# Patient Record
Sex: Female | Born: 1935 | Race: White | Hispanic: No | Smoking: Former smoker
Health system: Southern US, Community
[De-identification: ages and names within clinical notes are randomized; demographics above are authoritative.]

## PROBLEM LIST (undated history)

## (undated) DIAGNOSIS — G4700 Insomnia, unspecified: Secondary | ICD-10-CM

## (undated) DIAGNOSIS — Z86010 Personal history of colonic polyps: Secondary | ICD-10-CM

## (undated) DIAGNOSIS — R5381 Other malaise: Secondary | ICD-10-CM

## (undated) DIAGNOSIS — I10 Essential (primary) hypertension: Secondary | ICD-10-CM

## (undated) DIAGNOSIS — F068 Other specified mental disorders due to known physiological condition: Secondary | ICD-10-CM

## (undated) DIAGNOSIS — R413 Other amnesia: Secondary | ICD-10-CM

## (undated) DIAGNOSIS — N390 Urinary tract infection, site not specified: Secondary | ICD-10-CM

## (undated) DIAGNOSIS — N959 Unspecified menopausal and perimenopausal disorder: Secondary | ICD-10-CM

## (undated) DIAGNOSIS — M949 Disorder of cartilage, unspecified: Secondary | ICD-10-CM

## (undated) DIAGNOSIS — R300 Dysuria: Secondary | ICD-10-CM

## (undated) DIAGNOSIS — R232 Flushing: Secondary | ICD-10-CM

## (undated) DIAGNOSIS — K5730 Diverticulosis of large intestine without perforation or abscess without bleeding: Secondary | ICD-10-CM

## (undated) DIAGNOSIS — R5383 Other fatigue: Secondary | ICD-10-CM

## (undated) DIAGNOSIS — E785 Hyperlipidemia, unspecified: Secondary | ICD-10-CM

## (undated) DIAGNOSIS — M899 Disorder of bone, unspecified: Secondary | ICD-10-CM

## (undated) HISTORY — DX: Other fatigue: R53.83

## (undated) HISTORY — DX: Other specified mental disorders due to known physiological condition: F06.8

## (undated) HISTORY — DX: Disorder of cartilage, unspecified: M94.9

## (undated) HISTORY — DX: Other malaise: R53.81

## (undated) HISTORY — DX: Personal history of colonic polyps: Z86.010

## (undated) HISTORY — DX: Insomnia, unspecified: G47.00

## (undated) HISTORY — DX: Other amnesia: R41.3

## (undated) HISTORY — DX: Diverticulosis of large intestine without perforation or abscess without bleeding: K57.30

## (undated) HISTORY — PX: OTHER SURGICAL HISTORY: SHX169

## (undated) HISTORY — DX: Flushing: R23.2

## (undated) HISTORY — DX: Essential (primary) hypertension: I10

## (undated) HISTORY — DX: Disorder of bone, unspecified: M89.9

## (undated) HISTORY — DX: Urinary tract infection, site not specified: N39.0

## (undated) HISTORY — DX: Unspecified menopausal and perimenopausal disorder: N95.9

## (undated) HISTORY — DX: Dysuria: R30.0

## (undated) HISTORY — DX: Hyperlipidemia, unspecified: E78.5

---

## 1968-07-25 HISTORY — PX: CHOLECYSTECTOMY: SHX55

## 1968-07-25 HISTORY — PX: APPENDECTOMY: SHX54

## 1983-07-26 HISTORY — PX: ABDOMINAL HYSTERECTOMY: SHX81

## 1998-10-26 ENCOUNTER — Other Ambulatory Visit: Admission: RE | Admit: 1998-10-26 | Discharge: 1998-10-26 | Payer: Self-pay | Admitting: *Deleted

## 2000-03-26 ENCOUNTER — Encounter: Payer: Self-pay | Admitting: Emergency Medicine

## 2000-03-26 ENCOUNTER — Emergency Department (HOSPITAL_COMMUNITY): Admission: EM | Admit: 2000-03-26 | Discharge: 2000-03-26 | Payer: Self-pay | Admitting: Emergency Medicine

## 2001-03-05 ENCOUNTER — Encounter: Payer: Self-pay | Admitting: Cardiology

## 2001-03-05 ENCOUNTER — Ambulatory Visit (HOSPITAL_COMMUNITY): Admission: RE | Admit: 2001-03-05 | Discharge: 2001-03-05 | Payer: Self-pay | Admitting: Cardiology

## 2003-01-13 ENCOUNTER — Ambulatory Visit (HOSPITAL_COMMUNITY): Admission: RE | Admit: 2003-01-13 | Discharge: 2003-01-13 | Payer: Self-pay | Admitting: *Deleted

## 2003-07-26 HISTORY — PX: OTHER SURGICAL HISTORY: SHX169

## 2005-12-08 ENCOUNTER — Encounter: Admission: RE | Admit: 2005-12-08 | Discharge: 2005-12-08 | Payer: Self-pay | Admitting: Family Medicine

## 2006-08-02 ENCOUNTER — Ambulatory Visit: Payer: Self-pay | Admitting: Internal Medicine

## 2006-12-29 ENCOUNTER — Ambulatory Visit: Payer: Self-pay | Admitting: Internal Medicine

## 2007-01-25 ENCOUNTER — Ambulatory Visit: Payer: Self-pay | Admitting: Internal Medicine

## 2007-01-25 LAB — CONVERTED CEMR LAB
ALT: 21 units/L (ref 0–35)
Bilirubin, Direct: 0.1 mg/dL (ref 0.0–0.3)
CO2: 30 meq/L (ref 19–32)
Calcium: 9.6 mg/dL (ref 8.4–10.5)
Cholesterol: 225 mg/dL (ref 0–200)
Direct LDL: 136.1 mg/dL
GFR calc Af Amer: 90 mL/min
GFR calc non Af Amer: 75 mL/min
Glucose, Bld: 95 mg/dL (ref 70–99)
HDL: 67.6 mg/dL (ref >39.0)
Sodium: 142 meq/L (ref 135–145)
Total CHOL/HDL Ratio: 3.3
Total Protein: 6.9 g/dL (ref 6.0–8.3)
Triglycerides: 137 mg/dL (ref 0–149)
Vit D, 1,25-Dihydroxy: 47 (ref 20–57)

## 2007-02-08 ENCOUNTER — Ambulatory Visit: Payer: Self-pay | Admitting: Internal Medicine

## 2007-10-09 ENCOUNTER — Encounter: Payer: Self-pay | Admitting: Internal Medicine

## 2007-10-19 ENCOUNTER — Ambulatory Visit: Payer: Self-pay | Admitting: Internal Medicine

## 2007-10-19 LAB — CONVERTED CEMR LAB
ALT: 21 units/L (ref 0–35)
Cholesterol: 204 mg/dL (ref 0–200)
Direct LDL: 124.9 mg/dL
Total CHOL/HDL Ratio: 3
VLDL: 14 mg/dL (ref 0–40)

## 2007-10-26 ENCOUNTER — Ambulatory Visit: Payer: Self-pay | Admitting: Internal Medicine

## 2007-10-26 DIAGNOSIS — E785 Hyperlipidemia, unspecified: Secondary | ICD-10-CM

## 2007-10-26 HISTORY — DX: Hyperlipidemia, unspecified: E78.5

## 2007-10-26 LAB — CONVERTED CEMR LAB

## 2008-01-02 ENCOUNTER — Telehealth: Payer: Self-pay | Admitting: Internal Medicine

## 2008-01-23 ENCOUNTER — Encounter: Payer: Self-pay | Admitting: Internal Medicine

## 2008-01-24 ENCOUNTER — Encounter: Payer: Self-pay | Admitting: Internal Medicine

## 2008-02-20 ENCOUNTER — Encounter: Payer: Self-pay | Admitting: Internal Medicine

## 2008-02-25 ENCOUNTER — Ambulatory Visit: Payer: Self-pay | Admitting: Internal Medicine

## 2008-02-25 DIAGNOSIS — M949 Disorder of cartilage, unspecified: Secondary | ICD-10-CM

## 2008-02-25 DIAGNOSIS — M899 Disorder of bone, unspecified: Secondary | ICD-10-CM | POA: Insufficient documentation

## 2008-02-25 DIAGNOSIS — Z860100 Personal history of colon polyps, unspecified: Secondary | ICD-10-CM

## 2008-02-25 DIAGNOSIS — R413 Other amnesia: Secondary | ICD-10-CM

## 2008-02-25 HISTORY — DX: Other amnesia: R41.3

## 2008-02-25 HISTORY — DX: Personal history of colonic polyps: Z86.010

## 2008-02-25 HISTORY — DX: Disorder of bone, unspecified: M89.9

## 2008-02-25 HISTORY — DX: Personal history of colon polyps, unspecified: Z86.0100

## 2008-02-26 LAB — CONVERTED CEMR LAB
Alkaline Phosphatase: 81 units/L (ref 39–117)
Basophils Relative: 0.5 % (ref 0.0–3.0)
Bilirubin, Direct: 0.2 mg/dL (ref 0.0–0.3)
CO2: 32 meq/L (ref 19–32)
Chloride: 106 meq/L (ref 96–112)
Cholesterol: 158 mg/dL (ref 0–200)
Eosinophils Relative: 1.7 % (ref 0.0–5.0)
GFR calc Af Amer: 79 mL/min
Glucose, Bld: 88 mg/dL (ref 70–99)
Hemoglobin: 14.6 g/dL (ref 12.0–15.0)
LDL Cholesterol: 82 mg/dL (ref 0–99)
Lymphocytes Relative: 29.9 % (ref 12.0–46.0)
Monocytes Relative: 9.7 % (ref 3.0–12.0)
Mucus, UA: NEGATIVE
Neutrophils Relative %: 58.2 % (ref 43.0–77.0)
Potassium: 4.5 meq/L (ref 3.5–5.1)
RBC: 4.55 M/uL (ref 3.87–5.11)
Sodium: 142 meq/L (ref 135–145)
Specific Gravity, Urine: <=1.005 (ref 1.000–1.03)
Total CHOL/HDL Ratio: 2.6
Total Protein: 6.9 g/dL (ref 6.0–8.3)
Urobilinogen, UA: 0.2 (ref 0.0–1.0)
Vit D, 1,25-Dihydroxy: 41 (ref 30–89)
WBC: 7.4 10*3/uL (ref 4.5–10.5)
pH: 7 (ref 5.0–8.0)

## 2008-02-27 ENCOUNTER — Telehealth (INDEPENDENT_AMBULATORY_CARE_PROVIDER_SITE_OTHER): Payer: Self-pay | Admitting: *Deleted

## 2008-03-14 ENCOUNTER — Telehealth (INDEPENDENT_AMBULATORY_CARE_PROVIDER_SITE_OTHER): Payer: Self-pay | Admitting: *Deleted

## 2008-05-16 ENCOUNTER — Encounter: Payer: Self-pay | Admitting: Internal Medicine

## 2008-05-22 ENCOUNTER — Encounter: Payer: Self-pay | Admitting: Internal Medicine

## 2008-05-29 ENCOUNTER — Telehealth: Payer: Self-pay | Admitting: Internal Medicine

## 2008-08-19 ENCOUNTER — Encounter: Payer: Self-pay | Admitting: Internal Medicine

## 2008-09-01 ENCOUNTER — Ambulatory Visit: Payer: Self-pay | Admitting: Internal Medicine

## 2008-09-01 DIAGNOSIS — I10 Essential (primary) hypertension: Secondary | ICD-10-CM | POA: Insufficient documentation

## 2008-09-01 HISTORY — DX: Essential (primary) hypertension: I10

## 2009-02-18 ENCOUNTER — Ambulatory Visit: Payer: Self-pay | Admitting: Internal Medicine

## 2009-02-18 LAB — CONVERTED CEMR LAB
AST: 35 units/L (ref 0–37)
Alkaline Phosphatase: 80 units/L (ref 39–117)
Basophils Absolute: 0 10*3/uL (ref 0.0–0.1)
CO2: 32 meq/L (ref 19–32)
Chloride: 109 meq/L (ref 96–112)
Creatinine, Ser: 0.9 mg/dL (ref 0.4–1.2)
HDL: 76.2 mg/dL (ref >39.00)
Ketones, ur: NEGATIVE mg/dL
LDL Cholesterol: 101 mg/dL — ABNORMAL HIGH (ref 0–99)
Lymphocytes Relative: 36.1 % (ref 12.0–46.0)
Monocytes Relative: 9.6 % (ref 3.0–12.0)
Neutrophils Relative %: 49 % (ref 43.0–77.0)
Platelets: 203 10*3/uL (ref 150.0–400.0)
RDW: 12.7 % (ref 11.5–14.6)
Sodium: 145 meq/L (ref 135–145)
Specific Gravity, Urine: 1.01 (ref 1.000–1.030)
Total Bilirubin: 0.9 mg/dL (ref 0.3–1.2)
Total CHOL/HDL Ratio: 2
Triglycerides: 50 mg/dL (ref 0.0–149.0)
Urine Glucose: NEGATIVE mg/dL
pH: 7 (ref 5.0–8.0)

## 2009-02-25 ENCOUNTER — Ambulatory Visit: Payer: Self-pay | Admitting: Internal Medicine

## 2009-02-26 ENCOUNTER — Ambulatory Visit (HOSPITAL_COMMUNITY): Admission: RE | Admit: 2009-02-26 | Discharge: 2009-02-26 | Payer: Self-pay | Admitting: Internal Medicine

## 2009-04-07 ENCOUNTER — Ambulatory Visit: Payer: Self-pay | Admitting: Gastroenterology

## 2009-04-21 ENCOUNTER — Ambulatory Visit: Payer: Self-pay | Admitting: Gastroenterology

## 2009-05-18 ENCOUNTER — Ambulatory Visit: Payer: Self-pay | Admitting: Internal Medicine

## 2009-05-18 DIAGNOSIS — N390 Urinary tract infection, site not specified: Secondary | ICD-10-CM

## 2009-05-18 HISTORY — DX: Urinary tract infection, site not specified: N39.0

## 2009-05-18 LAB — CONVERTED CEMR LAB
Bilirubin Urine: NEGATIVE
Ketones, urine, test strip: NEGATIVE
Specific Gravity, Urine: 1.01
Urobilinogen, UA: 0.2

## 2009-05-19 ENCOUNTER — Encounter: Payer: Self-pay | Admitting: Internal Medicine

## 2009-08-27 ENCOUNTER — Ambulatory Visit: Payer: Self-pay | Admitting: Internal Medicine

## 2009-08-27 DIAGNOSIS — F068 Other specified mental disorders due to known physiological condition: Secondary | ICD-10-CM

## 2009-08-27 HISTORY — DX: Other specified mental disorders due to known physiological condition: F06.8

## 2009-09-02 ENCOUNTER — Telehealth (INDEPENDENT_AMBULATORY_CARE_PROVIDER_SITE_OTHER): Payer: Self-pay | Admitting: *Deleted

## 2009-10-21 ENCOUNTER — Ambulatory Visit: Payer: Self-pay | Admitting: Internal Medicine

## 2009-10-21 DIAGNOSIS — R5383 Other fatigue: Secondary | ICD-10-CM

## 2009-10-21 DIAGNOSIS — R5381 Other malaise: Secondary | ICD-10-CM

## 2009-10-21 DIAGNOSIS — R232 Flushing: Secondary | ICD-10-CM

## 2009-10-21 DIAGNOSIS — G4700 Insomnia, unspecified: Secondary | ICD-10-CM

## 2009-10-21 HISTORY — DX: Other malaise: R53.81

## 2009-10-21 HISTORY — DX: Flushing: R23.2

## 2009-10-21 HISTORY — DX: Insomnia, unspecified: G47.00

## 2009-10-23 ENCOUNTER — Encounter: Payer: Self-pay | Admitting: Internal Medicine

## 2009-10-27 LAB — CONVERTED CEMR LAB: Metaneph Total, Ur: 225 ug/24hr (ref 224–832)

## 2010-02-18 ENCOUNTER — Ambulatory Visit: Payer: Self-pay | Admitting: Internal Medicine

## 2010-02-19 LAB — CONVERTED CEMR LAB
ALT: 25 units/L (ref 0–35)
AST: 42 units/L — ABNORMAL HIGH (ref 0–37)
Albumin: 3.9 g/dL (ref 3.5–5.2)
Alkaline Phosphatase: 77 units/L (ref 39–117)
BUN: 17 mg/dL (ref 6–23)
Basophils Relative: 0.7 % (ref 0.0–3.0)
Bilirubin Urine: NEGATIVE
CO2: 29 meq/L (ref 19–32)
Calcium: 8.7 mg/dL (ref 8.4–10.5)
Creatinine, Ser: 0.9 mg/dL (ref 0.4–1.2)
Eosinophils Relative: 7 % — ABNORMAL HIGH (ref 0.0–5.0)
HCT: 42.9 % (ref 36.0–46.0)
Hemoglobin, Urine: NEGATIVE
Hemoglobin: 14.6 g/dL (ref 12.0–15.0)
Lymphocytes Relative: 33.8 % (ref 12.0–46.0)
Lymphs Abs: 2 10*3/uL (ref 0.7–4.0)
Monocytes Relative: 12.8 % — ABNORMAL HIGH (ref 3.0–12.0)
Neutro Abs: 2.7 10*3/uL (ref 1.4–7.7)
Nitrite: NEGATIVE
RBC: 4.61 M/uL (ref 3.87–5.11)
TSH: 2.23 microintl units/mL (ref 0.35–5.50)
Total CHOL/HDL Ratio: 2
Total Protein, Urine: NEGATIVE mg/dL
Triglycerides: 41 mg/dL (ref 0.0–149.0)
Urine Glucose: NEGATIVE mg/dL
WBC: 5.9 10*3/uL (ref 4.5–10.5)
pH: 7.5 (ref 5.0–8.0)

## 2010-02-25 ENCOUNTER — Ambulatory Visit: Payer: Self-pay | Admitting: Family Medicine

## 2010-02-25 ENCOUNTER — Ambulatory Visit: Payer: Self-pay | Admitting: Internal Medicine

## 2010-02-25 DIAGNOSIS — N959 Unspecified menopausal and perimenopausal disorder: Secondary | ICD-10-CM

## 2010-02-25 HISTORY — DX: Unspecified menopausal and perimenopausal disorder: N95.9

## 2010-03-02 ENCOUNTER — Ambulatory Visit (HOSPITAL_COMMUNITY): Admission: RE | Admit: 2010-03-02 | Discharge: 2010-03-02 | Payer: Self-pay | Admitting: Internal Medicine

## 2010-04-12 ENCOUNTER — Ambulatory Visit: Payer: Self-pay | Admitting: Internal Medicine

## 2010-04-12 DIAGNOSIS — R300 Dysuria: Secondary | ICD-10-CM

## 2010-04-12 HISTORY — DX: Dysuria: R30.0

## 2010-04-12 LAB — CONVERTED CEMR LAB
Bilirubin Urine: NEGATIVE
Blood in Urine, dipstick: NEGATIVE
Ketones, urine, test strip: NEGATIVE
Urobilinogen, UA: 0.2
pH: 5

## 2010-04-13 ENCOUNTER — Encounter: Payer: Self-pay | Admitting: Internal Medicine

## 2010-04-16 ENCOUNTER — Encounter: Payer: Self-pay | Admitting: Internal Medicine

## 2010-04-16 ENCOUNTER — Telehealth: Payer: Self-pay | Admitting: Internal Medicine

## 2010-08-15 ENCOUNTER — Encounter: Payer: Self-pay | Admitting: Internal Medicine

## 2010-08-24 NOTE — Assessment & Plan Note (Signed)
Summary: 6 MO ROV/ NWS   Vital Signs:  Patient profile:   77 year old female Height:      64 inches Weight:      149 pounds BMI:     25.67 O2 Sat:      99 % on Room air Temp:     97 degrees F oral Pulse rate:   72 / minute BP sitting:   112 / 68  (left arm) Cuff size:   regular  Vitals Entered ByZella Ball Ewing (August 27, 2009 8:10 AM)  O2 Flow:  Room air  CC: 6 Mo ROV/RE   CC:  6 Mo ROV/RE.  History of Present Illness: overall doing well, no complaints, Pt denies CP, sob, doe, wheezing, orthopnea, pnd, worsening LE edema, palps, dizziness or syncope  Pt denies new neuro symptoms such as headache, facial or extremity weakness   Wt overall stable from last visit.  Memory short term mildly worse, cant remember her last colon and mammogram date though it was just in the fall, and forgot to f/u with dr borden/urology.    Problems Prior to Update: 1)  Dementia  (ICD-294.8) 2)  Uti  (ICD-599.0) 3)  Uti  (ICD-599.0) 4)  Neoplasm, Malignant, Colon, Family Hx, Sibling  (ICD-V16.0) 5)  Preventive Health Care  (ICD-V70.0) 6)  Hypertension  (ICD-401.9) 7)  Memory Loss  (ICD-780.93) 8)  Colonic Polyps, Hx of  (ICD-V12.72) 9)  Preventive Health Care  (ICD-V70.0) 10)  Osteopenia  (ICD-733.90) 11)  Health Maintenance Exam  (ICD-V70.0) 12)  Family History of Cad Female 1st Degree Relative <50  (ICD-V17.3) 13)  Hyperlipidemia  (ICD-272.4)  Medications Prior to Update: 1)  Avapro 150 Mg  Tabs (Irbesartan) .... Take 1/2 Tablet By Mouth Once A Day 2)  Crestor 40 Mg Tabs (Rosuvastatin Calcium) .... 1/2 By Mouth Once Daily 3)  Estroven   Tabs (Nutritional Supplements) .... Take 1 Tablet By Mouth Once A Day 4)  Garlique 400 Mg  Tbec (Garlic) .... Take 1 Tablet By Mouth Once A Day 5)  Centrum Silver   Tabs (Multiple Vitamins-Minerals) .... Take 1 Tablet By Mouth Once A Day 6)  Cranberry Plus Vitamin C 140-100-3 Mg-Mg-Unit  Caps (Cranberry-Vitamin C-Vitamin E) .... Take 1 Tablet By Mouth Once A  Day 7)  Low-Dose Aspirin 81 Mg  Tabs (Aspirin) .... Take 1 Tablet By Mouth Once A Day 8)  Metamucil Plus Calcium   Caps (Psyllium-Calcium) .... As Needed As Directed 9)  Citracal + D 315-200 Mg-Unit  Tabs (Calcium Citrate-Vitamin D) .... Take 1 Tablet By Mouth Two Times A Day 10)  Fish Oil 1000 Mg  Caps (Omega-3 Fatty Acids) .... Take 1 Tablet By Mouth Once A Day 11)  Ciprofloxacin Hcl 500 Mg Tabs (Ciprofloxacin Hcl) .Marland Kitchen.. 1 By Mouth Two Times A Day  Current Medications (verified): 1)  Avapro 150 Mg  Tabs (Irbesartan) .... Take 1/2 Tablet By Mouth Once A Day 2)  Crestor 40 Mg Tabs (Rosuvastatin Calcium) .... 1/2 By Mouth Once Daily 3)  Estroven   Tabs (Nutritional Supplements) .... Take 1 Tablet By Mouth Once A Day 4)  Garlique 400 Mg  Tbec (Garlic) .... Take 1 Tablet By Mouth Once A Day 5)  Centrum Silver   Tabs (Multiple Vitamins-Minerals) .... Take 1 Tablet By Mouth Once A Day 6)  Cranberry Plus Vitamin C 140-100-3 Mg-Mg-Unit  Caps (Cranberry-Vitamin C-Vitamin E) .... Take 1 Tablet By Mouth Once A Day 7)  Low-Dose Aspirin 81 Mg  Tabs (Aspirin) .... Take 1 Tablet By Mouth Once A Day 8)  Metamucil Plus Calcium   Caps (Psyllium-Calcium) .... As Needed As Directed 9)  Citracal + D 315-200 Mg-Unit  Tabs (Calcium Citrate-Vitamin D) .... Take 1 Tablet By Mouth Two Times A Day 10)  Fish Oil 1000 Mg  Caps (Omega-3 Fatty Acids) .... Take 1 Tablet By Mouth Once A Day 11)  Aricept 10 Mg Tabs (Donepezil Hcl) .Marland Kitchen.. 1 By Mouth Once Daily  - Generic  Allergies (verified): No Known Drug Allergies  Past History:  Past Surgical History: Last updated: 09/01/2008 Status post cholecystectomy 1970. Appendectomy 1970. Hysterectomy 1985. s/p bilat bunionectomy 2005 s/p right mastoidectomy at 74 mo old  Social History: Last updated: 02/25/2008 Patient is divorced, is originally from [place].  She is a retired Designer, jewellery.  She has 3 children, 2 of whom live in [place] and a son that  still lives in [place].  Alcohol use-no Former Smoker - She quit tobacco in January of 2000.  She was a light smoker for 30 years moved to be near family from [place] 1999  Risk Factors: Smoking Status: quit (10/26/2007)  Past Medical History: Hypertension. Hyperlipidemia. Osteopenia Colonic polyps, hx of Dementia  Review of Systems       all otherwise negative per pt - 12 system review done , though limited due to dementa, denies GU symptoms such as dysuria, freq, urgency or hematuria  Physical Exam  General:  alert and well-developed.   Head:  normocephalic and atraumatic.   Eyes:  vision grossly intact, pupils equal, and pupils round.   Ears:  R ear normal and L ear normal.   Nose:  no external deformity and no nasal discharge.   Mouth:  no gingival abnormalities and pharynx pink and moist.   Neck:  supple and no masses.   Lungs:  normal respiratory effort and normal breath sounds.   Heart:  normal rate and regular rhythm.   Abdomen:  soft, non-tender, and normal bowel sounds.   Msk:  no joint tenderness and no joint swelling.   Extremities:  no edema, no erythema  Neurologic:  cranial nerves II-XII intact and strength normal in all extremities.   Psych:  not depressed appearing.     Impression & Recommendations:  Problem # 1:  DEMENTIA (ICD-294.8)  prob alzheimer's type - will check MRI head, and start aricept   Orders: Radiology Referral (Radiology)  Problem # 2:  UTI (ICD-599.0)  missed her 6 mo f/u last july with urology with few atypical cells and incomplete empyting;  will refer back to dr borden - pt promises to make appt herself  Problem # 3:  HYPERTENSION (ICD-401.9)  Her updated medication list for this problem includes:    Avapro 150 Mg Tabs (Irbesartan) .Marland Kitchen... Take 1/2 tablet by mouth once a day  BP today: 112/68 Prior BP: 120/60 (05/18/2009)  Labs Reviewed: K+: 4.6 (02/18/2009) Creat: : 0.9 (02/18/2009)   Chol: 187 (02/18/2009)   HDL:  76.20 (02/18/2009)   LDL: 101 (02/18/2009)   TG: 50.0 (02/18/2009) stable overall by hx and exam, ok to continue meds/tx as is   Problem # 4:  HYPERLIPIDEMIA (ICD-272.4)  Her updated medication list for this problem includes:    Crestor 40 Mg Tabs (Rosuvastatin calcium) .Marland Kitchen... 1/2 by mouth once daily  Labs Reviewed: SGOT: 35 (02/18/2009)   SGPT: 19 (02/18/2009)   HDL:76.20 (02/18/2009), 61.3 (02/25/2008)  LDL:101 (02/18/2009), 82 (53/66/4403)  Chol:187 (02/18/2009), 158 (02/25/2008)  Trig:50.0 (  02/18/2009), 76 (02/25/2008) stable overall by hx and exam, ok to continue meds/tx as is , d/w pt, goal ldl less than 100  Complete Medication List: 1)  Avapro 150 Mg Tabs (Irbesartan) .... Take 1/2 tablet by mouth once a day 2)  Crestor 40 Mg Tabs (Rosuvastatin calcium) .... 1/2 by mouth once daily 3)  Estroven Tabs (Nutritional supplements) .... Take 1 tablet by mouth once a day 4)  Garlique 400 Mg Tbec (Garlic) .... Take 1 tablet by mouth once a day 5)  Centrum Silver Tabs (Multiple vitamins-minerals) .... Take 1 tablet by mouth once a day 6)  Cranberry Plus Vitamin C 140-100-3 Mg-mg-unit Caps (Cranberry-vitamin c-vitamin e) .... Take 1 tablet by mouth once a day 7)  Low-dose Aspirin 81 Mg Tabs (Aspirin) .... Take 1 tablet by mouth once a day 8)  Metamucil Plus Calcium Caps (Psyllium-calcium) .... As needed as directed 9)  Citracal + D 315-200 Mg-unit Tabs (Calcium citrate-vitamin d) .... Take 1 tablet by mouth two times a day 10)  Fish Oil 1000 Mg Caps (Omega-3 fatty acids) .... Take 1 tablet by mouth once a day 11)  Aricept 10 Mg Tabs (Donepezil hcl) .Marland Kitchen.. 1 by mouth once daily  - generic  Other Orders: Flu Vaccine 79yrs + (04540) Administration Flu vaccine - MCR (J8119)  Patient Instructions: 1)  you had the flu shot today 2)  please remember to call and make appt with Dr Laverle Patter at the urology 3)  You will be contacted about the referral(s) to: MRI for the brain 4)  start the generic  aricept at the 5 mg pill for one month, then go to 10 mg after that  5)  Continue all previous medications as before this visit  6)  Please schedule a follow-up appointment in 6 months with CPX labs and: 7)  B121/folate 285.9 Prescriptions: ARICEPT 10 MG TABS (DONEPEZIL HCL) 1 by mouth once daily  - generic  #30 x 11   Entered and Authorized by:   Corwin Levins MD   Signed by:   Corwin Levins MD on 08/27/2009   Method used:   Print then Give to Patient   RxID:   [id number] ARICEPT 5 MG TABS (DONEPEZIL HCL) 1 by mouth once daily - generic  #30 x 0   Entered and Authorized by:   Corwin Levins MD   Signed by:   Corwin Levins MD on 08/27/2009   Method used:   Print then Give to Patient   RxID:   [id number]    Flu Vaccine Consent Questions     Do you have a history of severe allergic reactions to this vaccine? no    Any prior history of allergic reactions to egg and/or gelatin? no    Do you have a sensitivity to the preservative Thimersol? no    Do you have a past history of Guillan-Barre Syndrome? no    Do you currently have an acute febrile illness? no    Have you ever had a severe reaction to latex? no    Vaccine information given and explained to patient? yes    Are you currently pregnant? no    Lot Number:[id number]   Exp Date:01/21/2010   Site Given  Left Deltoid IMdflu

## 2010-08-24 NOTE — Assessment & Plan Note (Signed)
Summary: 6 MO ROV /NWS  #   Vital Signs:  Patient profile:   77 year old female Height:      64 inches Weight:      143.75 pounds BMI:     24.76 O2 Sat:      95 % on Room air Temp:     97.7 degrees F oral Pulse rate:   68 / minute BP sitting:   92 / 52  (left arm) Cuff size:   regular  Vitals Entered By: Zella Ball Ewing CMA (AAMA) (February 25, 2010 8:08 AM)  O2 Flow:  Room air  Preventive Care Screening     sched for mammogram next wk;   CC: 6 month ROV/RE   CC:  6 month ROV/RE.  History of Present Illness: here to f/u  - overall doing well;  has some easy bruiing to the arms but noother overt bleeding;  Pt denies CP, sob, doe, wheezing, orthopnea, pnd, worsening LE edema, palps, dizziness or syncope  Pt denies new neuro symptoms such as headache, facial or extremity weakness  No fever, wt loss, night sweats, loss of appetite or other constitutional symptoms Does have some "shivers" to the head and neck area but no GU symptoms such as dysuria, freq or urgency or blood.  Escercises regularly,  Has some increasing foretfulness. Has only about 5 hrs sleep per night and has to try not to take naps during the day b/c then wont sleep that night.   Problems Prior to Update: 1)  Facial Flushing  (ICD-782.62) 2)  Fatigue  (ICD-780.79) 3)  Insomnia-sleep Disorder-unspec  (ICD-780.52) 4)  Dementia  (ICD-294.8) 5)  Uti  (ICD-599.0) 6)  Uti  (ICD-599.0) 7)  Neoplasm, Malignant, Colon, Family Hx, Sibling  (ICD-V16.0) 8)  Preventive Health Care  (ICD-V70.0) 9)  Hypertension  (ICD-401.9) 10)  Memory Loss  (ICD-780.93) 11)  Colonic Polyps, Hx of  (ICD-V12.72) 12)  Preventive Health Care  (ICD-V70.0) 13)  Osteopenia  (ICD-733.90) 14)  Health Maintenance Exam  (ICD-V70.0) 15)  Family History of Cad Female 1st Degree Relative <50  (ICD-V17.3) 16)  Hyperlipidemia  (ICD-272.4)  Medications Prior to Update: 1)  Avapro 150 Mg  Tabs (Irbesartan) .... Take 1/2 Tablet By Mouth Once A Day 2)  Crestor  40 Mg Tabs (Rosuvastatin Calcium) .... 1/2 By Mouth Once Daily 3)  Estroven   Tabs (Nutritional Supplements) .... Take 1 Tablet By Mouth Once A Day 4)  Garlique 400 Mg  Tbec (Garlic) .... Take 1 Tablet By Mouth Once A Day 5)  Centrum Silver   Tabs (Multiple Vitamins-Minerals) .... Take 1 Tablet By Mouth Once A Day 6)  Cranberry Plus Vitamin C 140-100-3 Mg-Mg-Unit  Caps (Cranberry-Vitamin C-Vitamin E) .... Take 1 Tablet By Mouth Once A Day 7)  Low-Dose Aspirin 81 Mg  Tabs (Aspirin) .... Take 1 Tablet By Mouth Once A Day 8)  Metamucil Plus Calcium   Caps (Psyllium-Calcium) .... As Needed As Directed 9)  Citracal + D 315-200 Mg-Unit  Tabs (Calcium Citrate-Vitamin D) .... Take 1 Tablet By Mouth Two Times A Day 10)  Fish Oil 1000 Mg  Caps (Omega-3 Fatty Acids) .... Take 1 Tablet By Mouth Once A Day 11)  Aricept 10 Mg Tabs (Donepezil Hcl) .Marland Kitchen.. 1 By Mouth Once Daily  - Generic 12)  Zolpidem Tartrate 5 Mg Tabs (Zolpidem Tartrate) .Marland Kitchen.. 1 By Mouth At Bedtime As Needed  Current Medications (verified): 1)  Avapro 150 Mg  Tabs (Irbesartan) .... Take 1/2 Tablet  By Mouth Once A Day 2)  Crestor 40 Mg Tabs (Rosuvastatin Calcium) .... 1/2 By Mouth Once Daily 3)  Estroven   Tabs (Nutritional Supplements) .... Take 1 Tablet By Mouth Once A Day 4)  Garlique 400 Mg  Tbec (Garlic) .... Take 1 Tablet By Mouth Once A Day 5)  Centrum Silver   Tabs (Multiple Vitamins-Minerals) .... Take 1 Tablet By Mouth Once A Day 6)  Cranberry Plus Vitamin C 140-100-3 Mg-Mg-Unit  Caps (Cranberry-Vitamin C-Vitamin E) .... Take 1 Tablet By Mouth Once A Day 7)  Low-Dose Aspirin 81 Mg  Tabs (Aspirin) .... Take 1 Tablet By Mouth Once A Day 8)  Metamucil Plus Calcium   Caps (Psyllium-Calcium) .... As Needed As Directed 9)  Citracal + D 315-200 Mg-Unit  Tabs (Calcium Citrate-Vitamin D) .... Take 1 Tablet By Mouth Two Times A Day 10)  Fish Oil 1000 Mg  Caps (Omega-3 Fatty Acids) .... Take 1 Tablet By Mouth Once A Day 11)  Aricept 10 Mg Tabs  (Donepezil Hcl) .Marland Kitchen.. 1 By Mouth Once Daily  - Generic 12)  Zolpidem Tartrate 5 Mg Tabs (Zolpidem Tartrate) .Marland Kitchen.. 1 By Mouth At Bedtime As Needed  Allergies (verified): No Known Drug Allergies  Past History:  Past Medical History: Last updated: 08/27/2009 Hypertension. Hyperlipidemia. Osteopenia Colonic polyps, hx of Dementia  Past Surgical History: Last updated: 09/01/2008 Status post cholecystectomy 1970. Appendectomy 1970. Hysterectomy 1985. s/p bilat bunionectomy 2005 s/p right mastoidectomy at 62 mo old  Family History: Last updated: 10/21/2009 brother with elev chol, CBG, and colon cancer - died at 31 yo mother with CHF - died at 88yo twin sister had  breast cancer, DM, CABG at 37yo                                 mother with CHF - died at 81yo brother with elev chol, CABG and colon cancer  - died at 51yo twin sister with breast cancer, CABG after mult angioplasties  Social History: Last updated: 02/25/2008 Patient is divorced, is originally from [place].  She is a retired Designer, jewellery.  She has 3 children, 2 of whom live in [place] and a son that still lives in [place].  Alcohol use-no Former Smoker - She quit tobacco in January of 2000.  She was a light smoker for 30 years moved to be near family from [place] 1999  Risk Factors: Smoking Status: quit (10/26/2007)  Review of Systems  The patient denies anorexia, fever, weight loss, weight gain, vision loss, decreased hearing, hoarseness, chest pain, syncope, dyspnea on exertion, peripheral edema, prolonged cough, headaches, hemoptysis, abdominal pain, melena, hematochezia, severe indigestion/heartburn, hematuria, muscle weakness, suspicious skin lesions, transient blindness, difficulty walking, depression, unusual weight change, abnormal bleeding, enlarged lymph nodes, and angioedema.         all otherwise negative per pt -    Physical Exam  General:   alert and well-developed.   Head:  normocephalic and atraumatic.   Eyes:  vision grossly intact, pupils equal, and pupils round.   Ears:  R ear normal and L ear normal.   Nose:  no external deformity and no nasal discharge.   Mouth:  no gingival abnormalities and pharynx pink and moist.   Neck:  supple and no masses.   Lungs:  normal respiratory effort and normal breath sounds.   Heart:  normal rate and regular rhythm.   Abdomen:  soft, non-tender, and normal  bowel sounds.   Msk:  no joint tenderness and no joint swelling.   Extremities:  no edema, no erythema  Neurologic:  cranial nerves II-XII intact and strength normal in all extremities.  , has mild to mod short term memory dysfunction Skin:  color normal and no rashes.   Psych:  not anxious appearing and not depressed appearing.     Impression & Recommendations:  Problem # 1:  Preventive Health Care (ICD-V70.0) Overall doing well, age appropriate education and counseling updated and referral for appropriate preventive services done unless declined, immunizations up to date or declined, diet counseling done if overweight, urged to quit smoking if smokes , most recent labs reviewed and current ordered if appropriate, ecg reviewed or declined (interpretation per ECG scanned in the EMR if done); information regarding Medicare Prevention requirements given if appropriate; speciality referrals updated as appropriate   Problem # 2:  MENOPAUSAL DISORDER (ICD-627.9) ok for dxa Orders: T-Bone Densitometry (78469)  Complete Medication List: 1)  Avapro 150 Mg Tabs (Irbesartan) .... Take 1/2 tablet by mouth once a day 2)  Crestor 40 Mg Tabs (Rosuvastatin calcium) .... 1/2 by mouth once daily 3)  Estroven Tabs (Nutritional supplements) .... Take 1 tablet by mouth once a day 4)  Garlique 400 Mg Tbec (Garlic) .... Take 1 tablet by mouth once a day 5)  Centrum Silver Tabs (Multiple vitamins-minerals) .... Take 1 tablet by mouth once a day 6)   Cranberry Plus Vitamin C 140-100-3 Mg-mg-unit Caps (Cranberry-vitamin c-vitamin e) .... Take 1 tablet by mouth once a day 7)  Low-dose Aspirin 81 Mg Tabs (Aspirin) .... Take 1 tablet by mouth once a day 8)  Metamucil Plus Calcium Caps (Psyllium-calcium) .... As needed as directed 9)  Citracal + D 315-200 Mg-unit Tabs (Calcium citrate-vitamin d) .... Take 1 tablet by mouth two times a day 10)  Fish Oil 1000 Mg Caps (Omega-3 fatty acids) .... Take 1 tablet by mouth once a day 11)  Aricept 10 Mg Tabs (Donepezil hcl) .Marland Kitchen.. 1 by mouth once daily  - generic 12)  Zolpidem Tartrate 5 Mg Tabs (Zolpidem tartrate) .Marland Kitchen.. 1 by mouth at bedtime as needed  Patient Instructions: 1)  please schedule the bone density as you leave today 2)  You are given all of the refills today 3)  Please remember you only take HALF of the Avapro and Crestor to help save money 4)  Remember you can see some older TV shoes on [employer] online, or you can pay $8/mo for shows on [employer] 5)  Please schedule a follow-up appointment in 6 months. Prescriptions: ZOLPIDEM TARTRATE 5 MG TABS (ZOLPIDEM TARTRATE) 1 by mouth at bedtime as needed  #90 x 1   Entered and Authorized by:   Corwin Levins MD   Signed by:   Corwin Levins MD on 02/25/2010   Method used:   Print then Give to Patient   RxID:   [id number] ARICEPT 10 MG TABS (DONEPEZIL HCL) 1 by mouth once daily  - generic  #90 x 3   Entered and Authorized by:   Corwin Levins MD   Signed by:   Corwin Levins MD on 02/25/2010   Method used:   Print then Give to Patient   RxID:   [id number] CRESTOR 40 MG TABS (ROSUVASTATIN CALCIUM) 1 by mouth once daily  #90 x 3   Entered and Authorized by:   Corwin Levins MD   Signed by:   Corwin Levins MD  on 02/25/2010   Method used:   Print then Give to Patient   RxID:   [id number] AVAPRO 150 MG  TABS (IRBESARTAN) Take 1 tablet by mouth once a day  #90 x 3   Entered and Authorized by:   Corwin Levins MD   Signed by:   Corwin Levins MD on  02/25/2010   Method used:   Print then Give to Patient   RxID:   [id number]   Appended Document: 6 MO ROV Natale Milch  # also for antibx for UTI -   see emr

## 2010-08-24 NOTE — Progress Notes (Signed)
Summary: [facility]/Patient  [facility]/Patient   Imported By: Sherian Rein 08/28/2009 14:25:31  _____________________________________________________________________  External Attachment:    Type:   Image     Comment:   External Document

## 2010-08-24 NOTE — Progress Notes (Signed)
Summary: pt decline appt  ---- Converted from flag ---- ---- 09/02/2009 12:45 PM, Corwin Levins MD wrote: she can certainly decline, but the reason for the MRI was to make sure there was no problem that could be treated that is causing her memory problem  ---- 09/02/2009 11:39 AM, Shelbie Proctor wrote: Lorain Childes - inform pt of this appt -( MRI HEAD) pt states she decline  states she feels she does not need this at this time . ------------------------------

## 2010-08-24 NOTE — Assessment & Plan Note (Signed)
Summary: BLADDER INFECTION? FLU SHOT /NWS   Vital Signs:  Patient profile:   77 year old female Height:      64 inches Weight:      147.13 pounds BMI:     25.35 O2 Sat:      96 % on Room air Temp:     98.3 degrees F oral Pulse rate:   66 / minute BP sitting:   112 / 62  (left arm) Cuff size:   regular  Vitals Entered By: Zella Ball Ewing CMA (AAMA) (April 12, 2010 2:21 PM)  O2 Flow:  Room air CC: UTI, flu shot/RE   CC:  UTI and flu shot/RE.  History of Present Illness: here for acute visit - c/o 2 wks gradually worsening dysuria with mild freq and urgency, but no hematuria, back pain/flank pain, n/v, high fevers, chills, rigors, or worsening confusion more than baseline.  Pt denies CP, worsening sob, doe, wheezing, orthopnea, pnd, worsening LE edema, palps, dizziness or syncope  Pt denies new neuro symptoms such as headache, facial or extremity weakness   No wt loss, night sweats, loss of appetite or other constitutional symptoms  Wants for flu shot.  Cannot recall last UTI, which was just last month.  Denies signficiant incontinence.   No worsening dementia related behaviors such as hallucinations, agitation.  Not clear, but for some reason did not have the dxa performed though ordered last visit.  Problems Prior to Update: 1)  Dysuria  (ICD-788.1) 2)  Menopausal Disorder  (ICD-627.9) 3)  Facial Flushing  (ICD-782.62) 4)  Fatigue  (ICD-780.79) 5)  Insomnia-sleep Disorder-unspec  (ICD-780.52) 6)  Dementia  (ICD-294.8) 7)  Uti  (ICD-599.0) 8)  Uti  (ICD-599.0) 9)  Neoplasm, Malignant, Colon, Family Hx, Sibling  (ICD-V16.0) 10)  Preventive Health Care  (ICD-V70.0) 11)  Hypertension  (ICD-401.9) 12)  Memory Loss  (ICD-780.93) 13)  Colonic Polyps, Hx of  (ICD-V12.72) 14)  Preventive Health Care  (ICD-V70.0) 15)  Osteopenia  (ICD-733.90) 16)  Health Maintenance Exam  (ICD-V70.0) 17)  Family History of Cad Female 1st Degree Relative <50  (ICD-V17.3) 18)  Hyperlipidemia   (ICD-272.4)  Medications Prior to Update: 1)  Avapro 150 Mg  Tabs (Irbesartan) .... Take 1/2 Tablet By Mouth Once A Day 2)  Crestor 40 Mg Tabs (Rosuvastatin Calcium) .... 1/2 By Mouth Once Daily 3)  Estroven   Tabs (Nutritional Supplements) .... Take 1 Tablet By Mouth Once A Day 4)  Garlique 400 Mg  Tbec (Garlic) .... Take 1 Tablet By Mouth Once A Day 5)  Centrum Silver   Tabs (Multiple Vitamins-Minerals) .... Take 1 Tablet By Mouth Once A Day 6)  Cranberry Plus Vitamin C 140-100-3 Mg-Mg-Unit  Caps (Cranberry-Vitamin C-Vitamin E) .... Take 1 Tablet By Mouth Once A Day 7)  Low-Dose Aspirin 81 Mg  Tabs (Aspirin) .... Take 1 Tablet By Mouth Once A Day 8)  Metamucil Plus Calcium   Caps (Psyllium-Calcium) .... As Needed As Directed 9)  Citracal + D 315-200 Mg-Unit  Tabs (Calcium Citrate-Vitamin D) .... Take 1 Tablet By Mouth Two Times A Day 10)  Fish Oil 1000 Mg  Caps (Omega-3 Fatty Acids) .... Take 1 Tablet By Mouth Once A Day 11)  Aricept 10 Mg Tabs (Donepezil Hcl) .Marland Kitchen.. 1 By Mouth Once Daily  - Generic 12)  Zolpidem Tartrate 5 Mg Tabs (Zolpidem Tartrate) .Marland Kitchen.. 1 By Mouth At Bedtime As Needed 13)  Cephalexin 500 Mg Caps (Cephalexin) .Marland Kitchen.. 1po Three Times A Day  Current Medications (  verified): 1)  Avapro 150 Mg  Tabs (Irbesartan) .... Take 1/2 Tablet By Mouth Once A Day 2)  Crestor 40 Mg Tabs (Rosuvastatin Calcium) .... 1/2 By Mouth Once Daily 3)  Estroven   Tabs (Nutritional Supplements) .... Take 1 Tablet By Mouth Once A Day 4)  Garlique 400 Mg  Tbec (Garlic) .... Take 1 Tablet By Mouth Once A Day 5)  Centrum Silver   Tabs (Multiple Vitamins-Minerals) .... Take 1 Tablet By Mouth Once A Day 6)  Cranberry Plus Vitamin C 140-100-3 Mg-Mg-Unit  Caps (Cranberry-Vitamin C-Vitamin E) .... Take 1 Tablet By Mouth Once A Day 7)  Low-Dose Aspirin 81 Mg  Tabs (Aspirin) .... Take 1 Tablet By Mouth Once A Day 8)  Metamucil Plus Calcium   Caps (Psyllium-Calcium) .... As Needed As Directed 9)  Citracal + D  315-200 Mg-Unit  Tabs (Calcium Citrate-Vitamin D) .... Take 1 Tablet By Mouth Two Times A Day 10)  Fish Oil 1000 Mg  Caps (Omega-3 Fatty Acids) .... Take 1 Tablet By Mouth Once A Day 11)  Aricept 10 Mg Tabs (Donepezil Hcl) .Marland Kitchen.. 1 By Mouth Once Daily  - Generic 12)  Zolpidem Tartrate 5 Mg Tabs (Zolpidem Tartrate) .Marland Kitchen.. 1 By Mouth At Bedtime As Needed 13)  Ciprofloxacin Hcl 500 Mg Tabs (Ciprofloxacin Hcl) .Marland Kitchen.. 1po Two Times A Day  Allergies (verified): No Known Drug Allergies  Past History:  Past Medical History: Last updated: 08/27/2009 Hypertension. Hyperlipidemia. Osteopenia Colonic polyps, hx of Dementia  Past Surgical History: Last updated: 09/01/2008 Status post cholecystectomy 1970. Appendectomy 1970. Hysterectomy 1985. s/p bilat bunionectomy 2005 s/p right mastoidectomy at 28 mo old  Social History: Last updated: 02/25/2008 Patient is divorced, is originally from [place].  She is a retired Designer, jewellery.  She has 3 children, 2 of whom live in [place] and a son that still lives in [place].  Alcohol use-no Former Smoker - She quit tobacco in January of 2000.  She was a light smoker for 30 years moved to be near family from [place] 1999  Risk Factors: Smoking Status: quit (10/26/2007)  Review of Systems       all otherwise negative per pt -    Physical Exam  General:  alert and well-developed.   Head:  normocephalic and atraumatic.   Eyes:  vision grossly intact, pupils equal, and pupils round.   Ears:  R ear normal and L ear normal.   Nose:  no external deformity and no nasal discharge.   Mouth:  no gingival abnormalities and pharynx pink and moist.   Neck:  supple and no masses.   Lungs:  normal respiratory effort and normal breath sounds.   Heart:  normal rate and regular rhythm.   Abdomen:  soft and normal bowel sounds.  with low mid abd tender without guarding or rebound Msk:  no flank tender Extremities:  no edema, no  erythema  Neurologic:  at baseline level of confusion - pleasant mild memory /cognitive dysfunction   Impression & Recommendations:  Problem # 1:  DYSURIA (ICD-788.1)  Her updated medication list for this problem includes:    Ciprofloxacin Hcl 500 Mg Tabs (Ciprofloxacin hcl) .Marland Kitchen... 1po two times a day  Orders: T-Culture, Urine ([id number]) treat as above, f/u any worsening signs or symptoms , for urine cx today, pt to call with any worsening symptoms  Problem # 2:  MENOPAUSAL DISORDER (ICD-627.9) for some reason not done last visit  - for dxa Orders: T-Bone Densitometry (09811)  Problem #  3:  HYPERTENSION (ICD-401.9)  Her updated medication list for this problem includes:    Avapro 150 Mg Tabs (Irbesartan) .Marland Kitchen... Take 1/2 tablet by mouth once a day  BP today: 112/62 Prior BP: 92/52 (02/25/2010)  Labs Reviewed: K+: 5.2 (02/18/2010) Creat: : 0.9 (02/18/2010)   Chol: 175 (02/18/2010)   HDL: 71.10 (02/18/2010)   LDL: 96 (02/18/2010)   TG: 41.0 (02/18/2010) stable overall by hx and exam, ok to continue meds/tx as is   Problem # 4:  DEMENTIA (ICD-294.8) stable overall by hx and exam, ok to continue meds/tx as is - declines meds today such as aricept  Complete Medication List: 1)  Avapro 150 Mg Tabs (Irbesartan) .... Take 1/2 tablet by mouth once a day 2)  Crestor 40 Mg Tabs (Rosuvastatin calcium) .... 1/2 by mouth once daily 3)  Estroven Tabs (Nutritional supplements) .... Take 1 tablet by mouth once a day 4)  Garlique 400 Mg Tbec (Garlic) .... Take 1 tablet by mouth once a day 5)  Centrum Silver Tabs (Multiple vitamins-minerals) .... Take 1 tablet by mouth once a day 6)  Cranberry Plus Vitamin C 140-100-3 Mg-mg-unit Caps (Cranberry-vitamin c-vitamin e) .... Take 1 tablet by mouth once a day 7)  Low-dose Aspirin 81 Mg Tabs (Aspirin) .... Take 1 tablet by mouth once a day 8)  Metamucil Plus Calcium Caps (Psyllium-calcium) .... As needed as directed 9)  Citracal + D 315-200  Mg-unit Tabs (Calcium citrate-vitamin d) .... Take 1 tablet by mouth two times a day 10)  Fish Oil 1000 Mg Caps (Omega-3 fatty acids) .... Take 1 tablet by mouth once a day 11)  Aricept 10 Mg Tabs (Donepezil hcl) .Marland Kitchen.. 1 by mouth once daily  - generic 12)  Zolpidem Tartrate 5 Mg Tabs (Zolpidem tartrate) .Marland Kitchen.. 1 by mouth at bedtime as needed 13)  Ciprofloxacin Hcl 500 Mg Tabs (Ciprofloxacin hcl) .Marland Kitchen.. 1po two times a day  Other Orders: Flu Vaccine 78yrs + ([address]) Administration Flu vaccine - MCR (G0008) UA Dipstick W/ Micro (manual) (60454)  Patient Instructions: 1)  Please take all new medications as prescribed  - the cipro antibioitic was sent to your pharmacy ([facility]) 2)  Continue all previous medications as before this visit 3)  Your urine will be sent for culture 4)  Please call the number on the [facility] Card for results of your testing  5)  Please schedule your bone density before leaving today 6)  you had the flu shot today 7)  Please schedule a follow-up appointment as needed Prescriptions: CIPROFLOXACIN HCL 500 MG TABS (CIPROFLOXACIN HCL) 1po two times a day  #20 x 0   Entered and Authorized by:   Corwin Levins MD   Signed by:   Corwin Levins MD on 04/12/2010   Method used:   Electronically to        [facility]  [phone]* (retail)       [address]       [place], [place]  [address]       Ph: [phone] or [phone]       Fax: [phone]   RxID:   [id number]    Flu Vaccine Consent Questions     Do you have a history of severe allergic reactions to this vaccine? no    Any prior history of allergic reactions to egg and/or gelatin? no    Do you have a sensitivity to the preservative Thimersol? no  Do you have a past history of Guillan-Barre Syndrome? no    Do you currently have an acute febrile illness? no    Have you ever had a severe reaction to latex? no    Vaccine information given and explained to patient? yes    Are you  currently pregnant? no    Lot Number:[id number]   Exp Date:01/21/2010   Site Given  Left Deltoid IMflu  Laboratory Results   Urine Tests    Routine Urinalysis   Color: yellow Appearance: Clear Glucose: negative   (Normal Range: Negative) Bilirubin: negative   (Normal Range: Negative) Ketone: negative   (Normal Range: Negative) Spec. Gravity: <1.005   (Normal Range: 1.003-1.035) Blood: negative   (Normal Range: Negative) pH: 5.0   (Normal Range: 5.0-8.0) Protein: negative   (Normal Range: Negative) Urobilinogen: 0.2   (Normal Range: 0-1) Nitrite: negative   (Normal Range: Negative) Leukocyte Esterace: moderate   (Normal Range: Negative)

## 2010-08-24 NOTE — Miscellaneous (Signed)
Summary: BONE DENSITY  Clinical Lists Changes  Orders: Added new Test order of T-Lumbar Vertebral Assessment (77082) - Signed 

## 2010-08-24 NOTE — Progress Notes (Signed)
  Phone Note Call from Patient   Caller: Patient Summary of Call: Patient came by the office and gave print out of medications. Checked by our list and medication list updated. Initial call taken by: Robin Ewing CMA Duncan Dull),  April 16, 2010 4:44 PM

## 2010-08-24 NOTE — Assessment & Plan Note (Signed)
Summary: SEVERAL THINGS TO DISCUSS/ MAY WANT FASTING LABS/NWS   Vital Signs:  Patient profile:   77 year old female Height:      64 inches Weight:      143 pounds BMI:     24.63 O2 Sat:      97 % on Room air Temp:     96.9 degrees F oral Pulse rate:   65 / minute BP sitting:   108 / 62  (left arm) Cuff size:   regular  Vitals Entered ByZella Ball Ewing (October 21, 2009 8:12 AM)  O2 Flow:  Room air  CC: Left leg spots, hot flashes, not sleeping well/RE   CC:  Left leg spots, hot flashes, and not sleeping well/RE.  History of Present Illness: here with flushing vs flashes  -   "I feel the heat coming up and I turn bright red" and people ask me if I'm embarrassed;  gets purpura to the gym if she bumps something;  hjas ongoing fatigue and gets up early and goes to the gym at 430am since she cant get back to sleep;  tries not to take naps during the day since she reall wont sleep at night;  tylenol PM can help but now doesnt work so well;  Pt denies CP, sob, doe, wheezing, orthopnea, pnd, worsening LE edema, palps, dizziness or syncope  Pt denies new neuro symptoms such as headache, facial or extremity weakness Denies depressive symtpoms or suicidal ideation.    Problems Prior to Update: 1)  Dementia  (ICD-294.8) 2)  Uti  (ICD-599.0) 3)  Uti  (ICD-599.0) 4)  Neoplasm, Malignant, Colon, Family Hx, Sibling  (ICD-V16.0) 5)  Preventive Health Care  (ICD-V70.0) 6)  Hypertension  (ICD-401.9) 7)  Memory Loss  (ICD-780.93) 8)  Colonic Polyps, Hx of  (ICD-V12.72) 9)  Preventive Health Care  (ICD-V70.0) 10)  Osteopenia  (ICD-733.90) 11)  Health Maintenance Exam  (ICD-V70.0) 12)  Family History of Cad Female 1st Degree Relative <50  (ICD-V17.3) 13)  Hyperlipidemia  (ICD-272.4)  Medications Prior to Update: 1)  Avapro 150 Mg  Tabs (Irbesartan) .... Take 1/2 Tablet By Mouth Once A Day 2)  Crestor 40 Mg Tabs (Rosuvastatin Calcium) .... 1/2 By Mouth Once Daily 3)  Estroven   Tabs (Nutritional  Supplements) .... Take 1 Tablet By Mouth Once A Day 4)  Garlique 400 Mg  Tbec (Garlic) .... Take 1 Tablet By Mouth Once A Day 5)  Centrum Silver   Tabs (Multiple Vitamins-Minerals) .... Take 1 Tablet By Mouth Once A Day 6)  Cranberry Plus Vitamin C 140-100-3 Mg-Mg-Unit  Caps (Cranberry-Vitamin C-Vitamin E) .... Take 1 Tablet By Mouth Once A Day 7)  Low-Dose Aspirin 81 Mg  Tabs (Aspirin) .... Take 1 Tablet By Mouth Once A Day 8)  Metamucil Plus Calcium   Caps (Psyllium-Calcium) .... As Needed As Directed 9)  Citracal + D 315-200 Mg-Unit  Tabs (Calcium Citrate-Vitamin D) .... Take 1 Tablet By Mouth Two Times A Day 10)  Fish Oil 1000 Mg  Caps (Omega-3 Fatty Acids) .... Take 1 Tablet By Mouth Once A Day 11)  Aricept 10 Mg Tabs (Donepezil Hcl) .Marland Kitchen.. 1 By Mouth Once Daily  - Generic  Current Medications (verified): 1)  Avapro 150 Mg  Tabs (Irbesartan) .... Take 1/2 Tablet By Mouth Once A Day 2)  Crestor 40 Mg Tabs (Rosuvastatin Calcium) .... 1/2 By Mouth Once Daily 3)  Estroven   Tabs (Nutritional Supplements) .... Take 1 Tablet By Mouth Once  A Day 4)  Garlique 400 Mg  Tbec (Garlic) .... Take 1 Tablet By Mouth Once A Day 5)  Centrum Silver   Tabs (Multiple Vitamins-Minerals) .... Take 1 Tablet By Mouth Once A Day 6)  Cranberry Plus Vitamin C 140-100-3 Mg-Mg-Unit  Caps (Cranberry-Vitamin C-Vitamin E) .... Take 1 Tablet By Mouth Once A Day 7)  Low-Dose Aspirin 81 Mg  Tabs (Aspirin) .... Take 1 Tablet By Mouth Once A Day 8)  Metamucil Plus Calcium   Caps (Psyllium-Calcium) .... As Needed As Directed 9)  Citracal + D 315-200 Mg-Unit  Tabs (Calcium Citrate-Vitamin D) .... Take 1 Tablet By Mouth Two Times A Day 10)  Fish Oil 1000 Mg  Caps (Omega-3 Fatty Acids) .... Take 1 Tablet By Mouth Once A Day 11)  Aricept 10 Mg Tabs (Donepezil Hcl) .Marland Kitchen.. 1 By Mouth Once Daily  - Generic 12)  Zolpidem Tartrate 5 Mg Tabs (Zolpidem Tartrate) .Marland Kitchen.. 1 By Mouth At Bedtime As Needed  Allergies (verified): No Known Drug  Allergies  Past History:  Past Medical History: Last updated: 08/27/2009 Hypertension. Hyperlipidemia. Osteopenia Colonic polyps, hx of Dementia  Past Surgical History: Last updated: 09/01/2008 Status post cholecystectomy 1970. Appendectomy 1970. Hysterectomy 1985. s/p bilat bunionectomy 2005 s/p right mastoidectomy at 13 mo old  Social History: Last updated: 02/25/2008 Patient is divorced, is originally from [place].  She is a retired Designer, jewellery.  She has 3 children, 2 of whom live in [place] and a son that still lives in [place].  Alcohol use-no Former Smoker - She quit tobacco in January of 2000.  She was a light smoker for 30 years moved to be near family from [place] 1999  Risk Factors: Smoking Status: quit (10/26/2007)  Family History: brother with elev chol, CBG, and colon cancer - died at 36 yo mother with CHF - died at 10yo twin sister had  breast cancer, DM, CABG at 12yo                                 mother with CHF - died at 23yo brother with elev chol, CABG and colon cancer  - died at 37yo twin sister with breast cancer, CABG after mult angioplasties  Review of Systems       all otherwise negative per pt -    Physical Exam  General:  alert and well-developed.   Head:  normocephalic and atraumatic.   Eyes:  vision grossly intact, pupils equal, and pupils round.   Ears:  R ear normal and L ear normal.   Nose:  no external deformity and no nasal discharge.   Mouth:  no gingival abnormalities and pharynx pink and moist.   Neck:  supple and no masses.   Lungs:  normal respiratory effort and normal breath sounds.   Heart:  normal rate and regular rhythm.   Abdomen:  soft, non-tender, and normal bowel sounds.   Msk:  no joint tenderness and no joint swelling.   Extremities:  no edema, no erythema  Neurologic:  cranial nerves II-XII intact and strength normal in all extremities.      Impression & Recommendations:  Problem # 1:  FATIGUE (ICD-780.79) exam benign, reviewed labs on emr from july 2010 to present;  unclear etiology, denies depressive symtpoms, ok to follow for now, suspect some geriatric decline although keeps up well at the gym with 60 min on the aerobic machines per day (for the lsat  10 yrs) and 30 min on other machines  Problem # 2:  INSOMNIA-SLEEP DISORDER-UNSPEC (ICD-780.52)  for ambien as needed   Her updated medication list for this problem includes:    Zolpidem Tartrate 5 Mg Tabs (Zolpidem tartrate) .Marland Kitchen... 1 by mouth at bedtime as needed  Problem # 3:  HYPERTENSION (ICD-401.9)  Her updated medication list for this problem includes:    Avapro 150 Mg Tabs (Irbesartan) .Marland Kitchen... Take 1/2 tablet by mouth once a day  BP today: 108/62 Prior BP: 112/68 (08/27/2009)  Labs Reviewed: K+: 4.6 (02/18/2009) Creat: : 0.9 (02/18/2009)   Chol: 187 (02/18/2009)   HDL: 76.20 (02/18/2009)   LDL: 101 (02/18/2009)   TG: 50.0 (02/18/2009) stable overall by hx and exam, ok to continue meds/tx as is   Problem # 4:  HYPERLIPIDEMIA (ICD-272.4)  Her updated medication list for this problem includes:    Crestor 40 Mg Tabs (Rosuvastatin calcium) .Marland Kitchen... 1/2 by mouth once daily  Labs Reviewed: SGOT: 35 (02/18/2009)   SGPT: 19 (02/18/2009)   HDL:76.20 (02/18/2009), 61.3 (02/25/2008)  LDL:101 (02/18/2009), 82 (04/54/0981)  Chol:187 (02/18/2009), 158 (02/25/2008)  Trig:50.0 (02/18/2009), 76 (02/25/2008) stable overall by hx and exam, ok to continue meds/tx as is   Problem # 5:  FACIAL FLUSHING (ICD-782.62)  unclear etiollogy - for 24 urines to r/o pheo  Orders: T-Urine 24 Hr. Catecholamines [phone]) T-Urine 24 Hr. Metanephrines [phone])  Complete Medication List: 1)  Avapro 150 Mg Tabs (Irbesartan) .... Take 1/2 tablet by mouth once a day 2)  Crestor 40 Mg Tabs (Rosuvastatin calcium) .... 1/2 by mouth once daily 3)  Estroven Tabs (Nutritional supplements)  .... Take 1 tablet by mouth once a day 4)  Garlique 400 Mg Tbec (Garlic) .... Take 1 tablet by mouth once a day 5)  Centrum Silver Tabs (Multiple vitamins-minerals) .... Take 1 tablet by mouth once a day 6)  Cranberry Plus Vitamin C 140-100-3 Mg-mg-unit Caps (Cranberry-vitamin c-vitamin e) .... Take 1 tablet by mouth once a day 7)  Low-dose Aspirin 81 Mg Tabs (Aspirin) .... Take 1 tablet by mouth once a day 8)  Metamucil Plus Calcium Caps (Psyllium-calcium) .... As needed as directed 9)  Citracal + D 315-200 Mg-unit Tabs (Calcium citrate-vitamin d) .... Take 1 tablet by mouth two times a day 10)  Fish Oil 1000 Mg Caps (Omega-3 fatty acids) .... Take 1 tablet by mouth once a day 11)  Aricept 10 Mg Tabs (Donepezil hcl) .Marland Kitchen.. 1 by mouth once daily  - generic 12)  Zolpidem Tartrate 5 Mg Tabs (Zolpidem tartrate) .Marland Kitchen.. 1 by mouth at bedtime as needed  Patient Instructions: 1)  Please take all new medications as prescribed 2)  Continue all previous medications as before this visit  3)  Please go to the Lab in the basement for your urine tests today  4)  Please schedule a follow-up appointment in 4 months with CPX labs Prescriptions: ZOLPIDEM TARTRATE 5 MG TABS (ZOLPIDEM TARTRATE) 1 by mouth at bedtime as needed  #30 x 5   Entered and Authorized by:   Corwin Levins MD   Signed by:   Corwin Levins MD on 10/21/2009   Method used:   Print then Give to Patient   RxID:   [id number]

## 2010-09-02 ENCOUNTER — Encounter: Payer: Self-pay | Admitting: Internal Medicine

## 2010-09-02 ENCOUNTER — Ambulatory Visit (INDEPENDENT_AMBULATORY_CARE_PROVIDER_SITE_OTHER): Payer: Medicare HMO | Admitting: Internal Medicine

## 2010-09-02 DIAGNOSIS — K5730 Diverticulosis of large intestine without perforation or abscess without bleeding: Secondary | ICD-10-CM | POA: Insufficient documentation

## 2010-09-02 DIAGNOSIS — I10 Essential (primary) hypertension: Secondary | ICD-10-CM

## 2010-09-02 DIAGNOSIS — M899 Disorder of bone, unspecified: Secondary | ICD-10-CM

## 2010-09-02 DIAGNOSIS — E785 Hyperlipidemia, unspecified: Secondary | ICD-10-CM

## 2010-09-02 DIAGNOSIS — F068 Other specified mental disorders due to known physiological condition: Secondary | ICD-10-CM

## 2010-09-02 HISTORY — DX: Diverticulosis of large intestine without perforation or abscess without bleeding: K57.30

## 2010-09-09 NOTE — Assessment & Plan Note (Signed)
Summary: 6 MOS FU/NWS #   Vital Signs:  Patient profile:   77 year old female Height:      66 inches Weight:      146.75 pounds BMI:     23.77 O2 Sat:      99 % on Room air Temp:     97.7 degrees F oral Pulse rate:   97 / minute BP sitting:   100 / 60  (left arm) Cuff size:   regular  Vitals Entered By: Zella Ball Ewing CMA Duncan Dull) (September 02, 2010 8:52 AM)  O2 Flow:  Room air  Preventive Care Screening  Bone Density:    Date:  02/25/2010    Next Due:  02/2012    Results:  abnormal std dev  Mammogram:    Next Due:  02/2011  CC: 6 month followup/RE   CC:  6 month followup/RE.  History of Present Illness: her to f/u - overall doing ok;  hx somewhat limited by dementia and no family with her today;  Pt denies CP, worsening sob, doe, wheezing, orthopnea, pnd, worsening LE edema, palps, dizziness or syncope  Pt denies new neuro symptoms such as headache, facial or extremity weakness  Pt denies polydipsia, polyuria   Overall good compliance with meds, trying to follow low chol diet, wt stable, little excercise however  Overall good compliance with meds, and good tolerability.  Denies worsening depressive symptoms, suicidal ideation, or panic.   No worsenig hallucinations, agitation or paranoia.  No fever, wt loss, night sweats, loss of appetite or other constitutional symptoms    Problems Prior to Update: 1)  Dysuria  (ICD-788.1) 2)  Menopausal Disorder  (ICD-627.9) 3)  Facial Flushing  (ICD-782.62) 4)  Fatigue  (ICD-780.79) 5)  Insomnia-sleep Disorder-unspec  (ICD-780.52) 6)  Dementia  (ICD-294.8) 7)  Uti  (ICD-599.0) 8)  Uti  (ICD-599.0) 9)  Neoplasm, Malignant, Colon, Family Hx, Sibling  (ICD-V16.0) 10)  Preventive Health Care  (ICD-V70.0) 11)  Hypertension  (ICD-401.9) 12)  Memory Loss  (ICD-780.93) 13)  Colonic Polyps, Hx of  (ICD-V12.72) 14)  Preventive Health Care  (ICD-V70.0) 15)  Osteopenia  (ICD-733.90) 16)  Health Maintenance Exam  (ICD-V70.0) 17)  Family  History of Cad Female 1st Degree Relative <50  (ICD-V17.3) 18)  Hyperlipidemia  (ICD-272.4)  Medications Prior to Update: 1)  Avapro 150 Mg  Tabs (Irbesartan) .... Take 1/2 Tablet By Mouth Once A Day 2)  Crestor 40 Mg Tabs (Rosuvastatin Calcium) .... 1/2 By Mouth Once Daily 3)  Estroven   Tabs (Nutritional Supplements) .... Take 1 Tablet By Mouth Once A Day 4)  Garlique 400 Mg  Tbec (Garlic) .... Take 1 Tablet By Mouth Once A Day 5)  Centrum Silver   Tabs (Multiple Vitamins-Minerals) .... Take 1 Tablet By Mouth Once A Day 6)  Cranberry Plus Vitamin C 140-100-3 Mg-Mg-Unit  Caps (Cranberry-Vitamin C-Vitamin E) .... Take 1 Tablet By Mouth Once A Day 7)  Low-Dose Aspirin 81 Mg  Tabs (Aspirin) .... Take 1 Tablet By Mouth Once A Day 8)  Metamucil Plus Calcium   Caps (Psyllium-Calcium) .... As Needed As Directed 9)  Citracal + D 315-200 Mg-Unit  Tabs (Calcium Citrate-Vitamin D) .... Take 1 Tablet By Mouth Two Times A Day 10)  Fish Oil 1000 Mg  Caps (Omega-3 Fatty Acids) .... Take 1 Tablet By Mouth Once A Day 11)  Aricept 10 Mg Tabs (Donepezil Hcl) .Marland Kitchen.. 1 By Mouth Once Daily  - Generic 12)  Zolpidem Tartrate 5 Mg  Tabs (Zolpidem Tartrate) .Marland Kitchen.. 1 By Mouth At Bedtime As Needed 13)  Nitrofurantoin Macrocrystal 100 Mg Caps (Nitrofurantoin Macrocrystal) .Marland Kitchen.. 1po Two Times A Day  Current Medications (verified): 1)  Avapro 150 Mg  Tabs (Irbesartan) .... Take 1/2 Tablet By Mouth Once A Day 2)  Crestor 40 Mg Tabs (Rosuvastatin Calcium) .... 1/2 By Mouth Once Daily 3)  Estroven   Tabs (Nutritional Supplements) .... Take 1 Tablet By Mouth Once A Day 4)  Garlique 400 Mg  Tbec (Garlic) .... Take 1 Tablet By Mouth Once A Day 5)  Centrum Silver   Tabs (Multiple Vitamins-Minerals) .... Take 1 Tablet By Mouth Once A Day 6)  Cranberry Plus Vitamin C 140-100-3 Mg-Mg-Unit  Caps (Cranberry-Vitamin C-Vitamin E) .... Take 1 Tablet By Mouth Once A Day 7)  Low-Dose Aspirin 81 Mg  Tabs (Aspirin) .... Take 1 Tablet By Mouth Once  A Day 8)  Metamucil Plus Calcium   Caps (Psyllium-Calcium) .... As Needed As Directed 9)  Citracal + D 315-200 Mg-Unit  Tabs (Calcium Citrate-Vitamin D) .... Take 1 Tablet By Mouth Two Times A Day 10)  Fish Oil 1000 Mg  Caps (Omega-3 Fatty Acids) .... Take 1 Tablet By Mouth Once A Day 11)  Aricept 10 Mg Tabs (Donepezil Hcl) .Marland Kitchen.. 1 By Mouth Once Daily  - Generic  Allergies (verified): No Known Drug Allergies  Past History:  Past Surgical History: Last updated: 09/01/2008 Status post cholecystectomy 1970. Appendectomy 1970. Hysterectomy 1985. s/p bilat bunionectomy 2005 s/p right mastoidectomy at 19 mo old  Social History: Last updated: 02/25/2008 Patient is divorced, is originally from [place].  She is a retired Designer, jewellery.  She has 3 children, 2 of whom live in [place] and a son that still lives in [place].  Alcohol use-no Former Smoker - She quit tobacco in January of 2000.  She was a light smoker for 30 years moved to be near family from [place] 1999  Risk Factors: Smoking Status: quit (10/26/2007)  Past Medical History: Hypertension. Hyperlipidemia. Osteopenia Colonic polyps, hx of Dementia Diverticulosis, colon  Family History: brother with elev chol, CBG, and colon cancer - died at 8 yo mother with CHF - died at 73yo twin sister had  breast cancer, DM, CABG at 66yo                              mother with CHF - died at 27yo brother with elev chol, CABG and colon cancer  - died at 70yo twin sister with breast cancer, CABG after mult angioplasties  Review of Systems       all otherwise negative per pt -    Physical Exam  General:  alert and well-developed.   Head:  normocephalic and atraumatic.   Eyes:  vision grossly intact, pupils equal, and pupils round.   Ears:  R ear normal and L ear normal.   Nose:  no external deformity and no nasal discharge.   Mouth:  no gingival abnormalities and  pharynx pink and moist.   Neck:  supple and no masses.   Lungs:  normal respiratory effort and normal breath sounds.   Heart:  normal rate and regular rhythm.   Extremities:  no edema, no erythema    Impression & Recommendations:  Problem # 1:  HYPERTENSION (ICD-401.9)  Her updated medication list for this problem includes:    Avapro 150 Mg Tabs (Irbesartan) .Marland Kitchen... Take 1/2 tablet by mouth once a  day  BP today: 100/60 Prior BP: 112/62 (04/12/2010)  Labs Reviewed: K+: 5.2 (02/18/2010) Creat: : 0.9 (02/18/2010)   Chol: 175 (02/18/2010)   HDL: 71.10 (02/18/2010)   LDL: 96 (02/18/2010)   TG: 41.0 (02/18/2010) stable overall by hx and exam, ok to continue meds/tx as is   Problem # 2:  HYPERLIPIDEMIA (ICD-272.4)  Her updated medication list for this problem includes:    Crestor 40 Mg Tabs (Rosuvastatin calcium) .Marland Kitchen... 1/2 by mouth once daily  Labs Reviewed: SGOT: 42 (02/18/2010)   SGPT: 25 (02/18/2010)   HDL:71.10 (02/18/2010), 76.20 (02/18/2009)  LDL:96 (02/18/2010), 101 (16/04/9603)  Chol:175 (02/18/2010), 187 (02/18/2009)  Trig:41.0 (02/18/2010), 50.0 (02/18/2009) stable overall by hx and exam, ok to continue meds/tx as is   Problem # 3:  DEMENTIA (ICD-294.8) stable overall by hx and exam, ok to continue meds/tx as is - tolerating the aricept well, and no behavioral worsening per pt  Problem # 4:  OSTEOPENIA (ICD-733.90) reviewed last dxa with pt - will hold on any bisphosonate tx at this time, to take otc oscal plus d  Complete Medication List: 1)  Avapro 150 Mg Tabs (Irbesartan) .... Take 1/2 tablet by mouth once a day 2)  Crestor 40 Mg Tabs (Rosuvastatin calcium) .... 1/2 by mouth once daily 3)  Estroven Tabs (Nutritional supplements) .... Take 1 tablet by mouth once a day 4)  Garlique 400 Mg Tbec (Garlic) .... Take 1 tablet by mouth once a day 5)  Centrum Silver Tabs (Multiple vitamins-minerals) .... Take 1 tablet by mouth once a day 6)  Cranberry Plus Vitamin C 140-100-3  Mg-mg-unit Caps (Cranberry-vitamin c-vitamin e) .... Take 1 tablet by mouth once a day 7)  Low-dose Aspirin 81 Mg Tabs (Aspirin) .... Take 1 tablet by mouth once a day 8)  Metamucil Plus Calcium Caps (Psyllium-calcium) .... As needed as directed 9)  Citracal + D 315-200 Mg-unit Tabs (Calcium citrate-vitamin d) .... Take 1 tablet by mouth two times a day 10)  Fish Oil 1000 Mg Caps (Omega-3 fatty acids) .... Take 1 tablet by mouth once a day 11)  Aricept 10 Mg Tabs (Donepezil hcl) .Marland Kitchen.. 1 by mouth once daily  - generic  Patient Instructions: 1)  Continue all previous medications as before this visit 2)  Please schedule a follow-up appointment in 6 months for CPX  with labs   Orders Added: 1)  Est. Patient Level IV [54098]

## 2010-12-05 ENCOUNTER — Encounter: Payer: Self-pay | Admitting: Internal Medicine

## 2010-12-05 DIAGNOSIS — Z0000 Encounter for general adult medical examination without abnormal findings: Secondary | ICD-10-CM | POA: Insufficient documentation

## 2010-12-07 ENCOUNTER — Ambulatory Visit (INDEPENDENT_AMBULATORY_CARE_PROVIDER_SITE_OTHER): Payer: Medicare HMO | Admitting: Internal Medicine

## 2010-12-07 ENCOUNTER — Encounter: Payer: Self-pay | Admitting: Internal Medicine

## 2010-12-07 VITALS — BP 120/70 | HR 66 | Temp 98.4°F | Ht 64.0 in | Wt 147.2 lb

## 2010-12-07 DIAGNOSIS — G4700 Insomnia, unspecified: Secondary | ICD-10-CM

## 2010-12-07 DIAGNOSIS — F068 Other specified mental disorders due to known physiological condition: Secondary | ICD-10-CM

## 2010-12-07 DIAGNOSIS — Z0000 Encounter for general adult medical examination without abnormal findings: Secondary | ICD-10-CM

## 2010-12-07 DIAGNOSIS — E785 Hyperlipidemia, unspecified: Secondary | ICD-10-CM

## 2010-12-07 DIAGNOSIS — I10 Essential (primary) hypertension: Secondary | ICD-10-CM

## 2010-12-07 MED ORDER — ZOLPIDEM TARTRATE 5 MG PO TABS: 5.0000 mg | ORAL_TABLET | Freq: Every evening | ORAL | Status: DC | PRN

## 2010-12-07 NOTE — Assessment & Plan Note (Addendum)
stable overall by hx and exam, most recent lab reviewed with pt, and pt ok to stay off the avapro for now  BP Readings from Last 3 Encounters:  12/07/10 120/70  09/02/10 100/60  04/12/10 112/62

## 2010-12-07 NOTE — Patient Instructions (Signed)
Please stop the avapro for blood pressure as you have Please re-start the generic aricept (donepezil) as prescribed Take all new medications as prescribed - the generic for ambien (zolpidem) Continue all other medications as before Please return in 3 mo with Lab testing done 3-5 days before

## 2010-12-07 NOTE — Assessment & Plan Note (Signed)
Mild to mod, to try ambien prn,  to f/u any worsening symptoms or concerns

## 2010-12-07 NOTE — Assessment & Plan Note (Signed)
stable overall by hx and exam, most recent lab reviewed with pt, and pt to continue medical treatment as before, including re-start of the aricept

## 2010-12-07 NOTE — Assessment & Plan Note (Signed)
.  stable overall by hx and exam, most recent lab reviewed with pt, and pt to continue medical treatment as before  Lab Results  Component Value Date   LDLCALC 96 02/18/2010

## 2010-12-07 NOTE — Progress Notes (Signed)
Subjective:    Patient ID: Rita Jones, female    DOB: 1930-12-11, 77 y.o.   MRN: [id number]  HPI   Dementia overall stable symptomatically with gradual worsening at best, and not assoc with behavioral changes such as hallucinations, paranoia, or agitation, but has somehow gotten the idea she should not take the aricept or avapro so not taking these.  Still taking the crestor. Pt denies chest pain, increased sob or doe, wheezing, orthopnea, PND, increased LE swelling, palpitations, dizziness or syncope.  Pt denies new neurological symptoms such as new headache, or facial or extremity weakness or numbness.   Pt denies polydipsia, polyuria.   Does have occasional bruise to the legs anteriorly but none now.  Pt denies fever, wt loss, night sweats, loss of appetite, or other constitutional symptoms.  Does have trouble getting to sleep nightly , OTC meds not owrking.  Denies worsening depressive symptoms, suicidal ideation, or panic, though has ongoing anxiety, not increased recently.    Past Medical History  Diagnosis Date  . HYPERLIPIDEMIA 10/26/2007  . DEMENTIA 08/27/2009  . HYPERTENSION 09/01/2008  . UTI 05/18/2009  . MENOPAUSAL DISORDER 02/25/2010  . OSTEOPENIA 02/25/2008  . INSOMNIA-SLEEP DISORDER-UNSPEC 10/21/2009  . FATIGUE 10/21/2009  . Memory loss 02/25/2008  . FACIAL FLUSHING 10/21/2009  . Dysuria 04/12/2010  . COLONIC POLYPS, HX OF 02/25/2008  . DIVERTICULOSIS, COLON 09/02/2010   Past Surgical History  Procedure Date  . Cholecystectomy 1970    s/p  . Appendectomy 1970  . Abdominal hysterectomy 1985  . S/p bilat bunionectomy 2005  . S/p right mastoidectomy     at 40 mo old    reports that she has quit smoking. She does not have any smokeless tobacco history on file. She reports that she does not drink alcohol. Her drug history not on file. family history includes Cancer in her brother and sister; Diabetes in her sister; and Hyperlipidemia in her brother. No Known Allergies   Current  Outpatient Prescriptions on File Prior to Visit  Medication Sig Dispense Refill  . aspirin 81 MG EC tablet Take 81 mg by mouth daily.        . calcium citrate-vitamin D (CITRACAL+D) 315-200 MG-UNIT per tablet Take 1 tablet by mouth 2 (two) times daily.        . Cranberry-Vitamin C-Vitamin E (CRANBERRY PLUS VITAMIN C) 140-100-3 MG-MG-UNIT CAPS Take by mouth daily.        . Garlic (GARLIQUE) 400 MG TBEC Take by mouth daily.        . Multiple Vitamin (MULTIVITAMIN) capsule Take 1 capsule by mouth daily.        . Nutritional Supplements (ESTROVEN) TABS Take by mouth daily.        . Omega-3 Fatty Acids (FISH OIL) 1000 MG CAPS Take by mouth daily.        . Psyllium-Calcium (METAMUCIL PLUS CALCIUM) CAPS Take by mouth. Use as directed       . rosuvastatin (CRESTOR) 40 MG tablet Take 40 mg by mouth. 1/2 by mouth once daily       . donepezil (ARICEPT) 10 MG tablet Take 10 mg by mouth daily.        . irbesartan (AVAPRO) 150 MG tablet Take 150 mg by mouth. Take 1/2 tablet by mouth once a day        Review of Systems All otherwise neg per pt     Objective:   Physical Exam BP 120/70  Pulse 66  Temp(Src) 98.4 F (36.9  C) (Oral)  Ht 5\' 4"  (1.626 m)  Wt 147 lb 4 oz (66.792 kg)  BMI 25.28 kg/m2  SpO2 97% Physical Exam  VS noted Constitutional: Pt appears well-developed and well-nourished.  HENT: Head: Normocephalic.  Right Ear: External ear normal.  Left Ear: External ear normal.  Eyes: Conjunctivae and EOM are normal. Pupils are equal, round, and reactive to light.  Neck: Normal range of motion. Neck supple.  Cardiovascular: Normal rate and regular rhythm.   Pulmonary/Chest: Effort normal and breath sounds normal.  Abd:  Soft, NT, non-distended, + BS Neurological: Pt is alert. No cranial nerve deficit.  Skin: Skin is warm. No erythema.  Psychiatric: Pt behavior is normal. Thought content normal except for ongoing memory loss      Assessment & Plan:

## 2010-12-10 NOTE — Assessment & Plan Note (Signed)
[facility]                           PRIMARY CARE OFFICE NOTE   CYLA, HALUSKA                 MRN:          [id number]  DATE:08/02/2006                            DOB:          02-Jul-1931    CHIEF COMPLAINT:  New patient practice.   HISTORY OF PRESENT ILLNESS:  The patient is a 77 year old white female  that appeared to establish primary care.  She was previously a patient  of Dr. Susann Givens and Dr. Lynelle Doctor, but had to choose with her insurance,  [employer].   PAST MEDICAL HISTORY:  Significant for hyperlipidemia and hypertension.  Her cholesterol issues were first identified, when her younger brother  had bypass surgery x5.  They found his cholesterol was quite elevated.  She has been on some sort of Statin since that time.  She does not  recall her most recent numbers.  In addition, he was diagnosed with  colon cancer, and she had an initial sigmoidoscopy and 3 or 4 subsequent  colonoscopies.  They have not discovered any cancer, but she is due for  her next colonoscopy  within the next 1 to 2 year time period.   There is also a family history of breast cancer.  Her twin sister had  breast cancer surgery in 1998.  Patient has been very good with yearly  mammograms.  She has not had any issues with breast cancer.   From a cardiovascular standpoint, she does have a remote history of  tobacco abuse but does not have any exercise intolerance.  She exercises  on a regular basis and denies any chronic dyspnea or chest discomfort.   PAST MEDICAL HISTORY SUMMARY:  1. Hypertension.  2. Hyperlipidemia.  3. Family history of breast cancer.  4. Family history of colon cancer.  5. Status post cholecystectomy 1970.  6. Appendectomy 1970.  7. Hysterectomy 1985.  8. History of colon polyps.   CURRENT MEDICATIONS:  1. Vytorin 10/40, one at bedtime.  2. Avapro 150 mg a day.  3. She takes multiple supplements including Garlique, Estroven,  Centrum Silver, Cranberry Caps/Vitamin C, red yeast rice, baby      aspirin, Metamucil, Citracal with vitamin D, Omega 3 fish oils.   SOCIAL HISTORY:  Patient is divorced, is originally from [place].  She is a retired Designer, jewellery.  She has 3  children, 2 of whom live in [place] and a son that still lives in  [place].   FAMILY HISTORY:  As noted above.   HABITS:  No alcohol.  She quit tobacco in January of 2000.  She was a  light smoker for 30 years, she states it helped maintain normal weight.   REVIEW OF SYSTEMS:  Patient is also followed by Urology for frequent  bladder infections.  It has been approximately one year since her last  bladder infection.  She has had also problems with hemorrhoids, which is  currently quiescent.  No HEENT symptoms.  Denies any heartburn, nausea,  vomiting, constipation, diarrhea.  No HEENT symptoms.  Denies any  heartburn, nausea, vomiting, constipation, diarrhea.  All other systems  negative.   PHYSICAL  EXAMINATION:  VITAL SIGNS:  Height is 5 feet 5, weight is 155  pounds, temperature is 97.8, pulse is 81, BP is 138/90 in the left arm  in a seated position with manual cuff.  GENERAL:  The patient is a very pleasant, well-developed, well-nourished  77 year old white female, no apparent distress.  HEENT:  Normocephalic, atraumatic.  Pupils are equal and reactive to  light bilaterally.  Extraocular muscles were intact.  Patient was  anicteric.  Tongue and palate within normal limits.  Patient has mild  cerumen in her right external auditory canal.  Hearing was grossly  normal.  Oropharyngeal exam was unremarkable.  NECK:  Supple with no adenopathy, carotid bruits or thyromegaly.  CHEST:  Normal respiratory effort.  Chest was clear to auscultation  bilaterally.  No rhonchi, rubs or wheezing.  CARDIOVASCULAR:  Regular rate and rhythm.  No significant murmurs, rubs  or gallops appreciated.  ABDOMEN:  Soft, nontender,  positive bowel sounds.  She has a mid-  abdominal scar.  MUSCULOSKELETAL:  No clubbing, cyanosis or edema.  SKIN:  Warm and dry, had no pain with lower extremity flexion, extension  or hip rotation.  Skin was warm and dry.  NEUROLOGIC:  Cranial nerves 2-12 was grossly intact.  She was nonfocal.   IMPRESSION/RECOMMENDATIONS:  1. Hypertension, controlled.  2. Hyperlipidemia, unknown status.  3. History of frequent bladder infections.  4. Family history of colon cancer.  5. Personal history of colon polyps.  6. Family history of breast cancer with normal mammograms.  7. Health maintenance.   RECOMMENDATIONS:  Patient does monitor her blood pressure at home and  states that normally it is in 120s systolic and 70s diastolic.  She is  to continue to monitor as an outpatient.  She had recent labs with Dr.  Lynelle Doctor, and we will try to obtain a copy to monitor her LFTs and kidney  functions.   Before next visit, we will also try to obtain records for previous DEXA-  Scan, and if need be she may need a repeat study.  In terms of her colon  screening, she is to call a gastroenterologist to set up a follow-up  appointment.  Follow-up time is in approximately 4 to 6 months.     Barbette Hair. Artist Pais, DO  Electronically Signed    RDY/MedQ  DD: 08/02/2006  DT: 08/02/2006  Job #: [id number]

## 2011-01-28 ENCOUNTER — Ambulatory Visit (INDEPENDENT_AMBULATORY_CARE_PROVIDER_SITE_OTHER): Payer: Medicare HMO | Admitting: Internal Medicine

## 2011-01-28 ENCOUNTER — Encounter: Payer: Self-pay | Admitting: Internal Medicine

## 2011-01-28 ENCOUNTER — Ambulatory Visit: Payer: Medicare HMO | Admitting: Internal Medicine

## 2011-01-28 VITALS — BP 120/80 | HR 71 | Temp 97.0°F | Ht 64.0 in | Wt 148.6 lb

## 2011-01-28 DIAGNOSIS — I10 Essential (primary) hypertension: Secondary | ICD-10-CM

## 2011-01-28 DIAGNOSIS — Z0000 Encounter for general adult medical examination without abnormal findings: Secondary | ICD-10-CM

## 2011-01-28 DIAGNOSIS — E785 Hyperlipidemia, unspecified: Secondary | ICD-10-CM

## 2011-01-28 DIAGNOSIS — G4700 Insomnia, unspecified: Secondary | ICD-10-CM

## 2011-01-28 DIAGNOSIS — F068 Other specified mental disorders due to known physiological condition: Secondary | ICD-10-CM

## 2011-01-28 MED ORDER — ZOLPIDEM TARTRATE 5 MG PO TABS: 5.0000 mg | ORAL_TABLET | Freq: Every evening | ORAL | Status: DC | PRN

## 2011-01-28 NOTE — Assessment & Plan Note (Signed)
Mild, for ambien prn,  to f/u any worsening symptoms or concerns 

## 2011-01-28 NOTE — Progress Notes (Signed)
Subjective:    Patient ID: Rita Jones, female    DOB: Jan 20, 1931, 77 y.o.   MRN: [id number]  HPI  Here to f/u;  C/o inability to get to sleep worse in the past few months, Dementia overall stable symptomatically with gradual worsening at best, and not assoc with behavioral changes such as hallucinations, paranoia, or agitation. Gets frustration over memory difficulty.  Pt denies chest pain, increased sob or doe, wheezing, orthopnea, PND, increased LE swelling, palpitations, dizziness or syncope. Pt denies new neurological symptoms such as new headache, or facial or extremity weakness or numbness   Pt denies polydipsia, polyuria. Overall good compliance with treatment, and good medicine tolerability.   Pt denies fever, wt loss, night sweats, loss of appetite, or other constitutional symptoms Past Medical History  Diagnosis Date  . HYPERLIPIDEMIA 10/26/2007  . DEMENTIA 08/27/2009  . HYPERTENSION 09/01/2008  . UTI 05/18/2009  . MENOPAUSAL DISORDER 02/25/2010  . OSTEOPENIA 02/25/2008  . INSOMNIA-SLEEP DISORDER-UNSPEC 10/21/2009  . FATIGUE 10/21/2009  . Memory loss 02/25/2008  . FACIAL FLUSHING 10/21/2009  . Dysuria 04/12/2010  . COLONIC POLYPS, HX OF 02/25/2008  . DIVERTICULOSIS, COLON 09/02/2010   Past Surgical History  Procedure Date  . Cholecystectomy 1970    s/p  . Appendectomy 1970  . Abdominal hysterectomy 1985  . S/p bilat bunionectomy 2005  . S/p right mastoidectomy     at 71 mo old    reports that she has quit smoking. She does not have any smokeless tobacco history on file. She reports that she does not drink alcohol. Her drug history not on file. family history includes Cancer in her brother and sister; Diabetes in her sister; and Hyperlipidemia in her brother. No Known Allergies Current Outpatient Prescriptions on File Prior to Visit  Medication Sig Dispense Refill  . aspirin 81 MG EC tablet Take 81 mg by mouth daily.        . calcium citrate-vitamin D (CITRACAL+D) 315-200 MG-UNIT  per tablet Take 1 tablet by mouth 2 (two) times daily.        . Cranberry-Vitamin C-Vitamin E (CRANBERRY PLUS VITAMIN C) 140-100-3 MG-MG-UNIT CAPS Take by mouth daily.        Marland Kitchen donepezil (ARICEPT) 10 MG tablet Take 10 mg by mouth daily.        . Garlic (GARLIQUE) 400 MG TBEC Take by mouth daily.        . Multiple Vitamin (MULTIVITAMIN) capsule Take 1 capsule by mouth daily.        . Nutritional Supplements (ESTROVEN) TABS Take by mouth daily.        . Omega-3 Fatty Acids (FISH OIL) 1000 MG CAPS Take by mouth daily.        . Psyllium-Calcium (METAMUCIL PLUS CALCIUM) CAPS Take by mouth. Use as directed       . rosuvastatin (CRESTOR) 40 MG tablet Take 40 mg by mouth. 1/2 by mouth once daily       . zolpidem (AMBIEN) 5 MG tablet Take 1 tablet (5 mg total) by mouth at bedtime as needed for sleep.  30 tablet  5   Review of Systems Review of Systems  Constitutional: Negative for diaphoresis and unexpected weight change.  HENT: Negative for drooling and tinnitus.   Eyes: Negative for photophobia and visual disturbance.  Respiratory: Negative for choking and stridor.   Gastrointestinal: Negative for vomiting and blood in stool.  Genitourinary: Negative for hematuria and decreased urine volume.  Musculoskeletal: Negative for gait problem.  Objective:   Physical Exam BP 120/80  Pulse 71  Temp(Src) 97 F (36.1 C) (Oral)  Ht 5\' 4"  (1.626 m)  Wt 148 lb 9 oz (67.388 kg)  BMI 25.50 kg/m2  SpO2 97% Physical Exam  VS noted, mild obese Constitutional: Pt appears well-developed and well-nourished.  HENT: Head: Normocephalic.  Right Ear: External ear normal.  Left Ear: External ear normal.  Eyes: Conjunctivae and EOM are normal. Pupils are equal, round, and reactive to light.  Neck: Normal range of motion. Neck supple.  Cardiovascular: Normal rate and regular rhythm.   Pulmonary/Chest: Effort normal and breath sounds normal.  Abd:  Soft, NT, non-distended, + BS Neurological: Pt is alert. No  cranial nerve deficit.  Skin: Skin is warm. No erythema.  Psychiatric: Pt behavior is normal. Not depressed or nervous       Assessment & Plan:

## 2011-01-28 NOTE — Assessment & Plan Note (Signed)
stable overall by hx and exam, most recent data reviewed with pt, and pt to continue medical treatment as before   Lab Results  Component Value Date   LDLCALC 96 02/18/2010

## 2011-01-28 NOTE — Assessment & Plan Note (Signed)
stable overall by hx and exam, most recent data reviewed with pt, and pt to continue medical treatment as before  Lab Results  Component Value Date   WBC 5.9 02/18/2010   HGB 14.6 02/18/2010   HCT 42.9 02/18/2010   PLT 192.0 02/18/2010   CHOL 175 02/18/2010   TRIG 41.0 02/18/2010   HDL 71.10 02/18/2010   LDLDIRECT 124.9 10/19/2007   ALT 25 02/18/2010   AST 42* 02/18/2010   NA 143 02/18/2010   K 5.2* 02/18/2010   CL 101 02/18/2010   CREATININE 0.9 02/18/2010   BUN 17 02/18/2010   CO2 29 02/18/2010   TSH 2.23 02/18/2010    Declines further tx at this time

## 2011-01-28 NOTE — Assessment & Plan Note (Signed)
stable overall by hx and exam, most recent data reviewed with pt, and pt to continue medical treatment as before  BP Readings from Last 3 Encounters:  01/28/11 120/80  12/07/10 120/70  09/02/10 100/60

## 2011-01-28 NOTE — Patient Instructions (Addendum)
Take all new medications as prescribed  - the ambien for sleep Continue all other medications as before Please return in 3 mo with Lab testing done 3-5 days before

## 2011-02-01 ENCOUNTER — Ambulatory Visit: Payer: Medicare HMO | Admitting: Internal Medicine

## 2011-02-02 ENCOUNTER — Encounter: Payer: Self-pay | Admitting: Internal Medicine

## 2011-02-02 ENCOUNTER — Ambulatory Visit (INDEPENDENT_AMBULATORY_CARE_PROVIDER_SITE_OTHER): Payer: Medicare HMO | Admitting: Internal Medicine

## 2011-02-02 VITALS — BP 122/72 | HR 71 | Temp 97.9°F | Ht 65.0 in | Wt 149.0 lb

## 2011-02-02 DIAGNOSIS — N390 Urinary tract infection, site not specified: Secondary | ICD-10-CM | POA: Insufficient documentation

## 2011-02-02 DIAGNOSIS — G4700 Insomnia, unspecified: Secondary | ICD-10-CM

## 2011-02-02 DIAGNOSIS — I10 Essential (primary) hypertension: Secondary | ICD-10-CM

## 2011-02-02 MED ORDER — CEPHALEXIN 500 MG PO CAPS: 500.0000 mg | ORAL_CAPSULE | Freq: Four times a day (QID) | ORAL | Status: AC

## 2011-02-02 NOTE — Assessment & Plan Note (Signed)
Improve and stable overall by hx and exam, and pt to continue medical treatment as before

## 2011-02-02 NOTE — Progress Notes (Signed)
Subjective:    Patient ID: Rita Jones, female    DOB: [birth year], 77 y.o.   MRN: [id number]  HPI  Pt here to f/u;  Has hx or recurrent UTI , about 2-3 times per yr lately per pt, now with similar early symtpoms for approx 3 days with dysuria, but  Denies urinary symptoms such as  frequency, urgency,or hematuria, back pain, n/v, f/c or abd pain or flank pain.  Pt denies chest pain, increased sob or doe, wheezing, orthopnea, PND, increased LE swelling, palpitations, dizziness or syncope.  Pt denies new neurological symptoms such as new headache, or facial or extremity weakness or numbness   Pt denies polydipsia, polyuria. Denies worsening depressive symptoms, suicidal ideation, or panic, though has ongoing anxiety, not increased recently, and insomnia has improved and tolerating the [place]. Past Medical History  Diagnosis Date  . HYPERLIPIDEMIA 10/26/2007  . DEMENTIA 08/27/2009  . HYPERTENSION 09/01/2008  . UTI 05/18/2009  . MENOPAUSAL DISORDER 02/25/2010  . OSTEOPENIA 02/25/2008  . INSOMNIA-SLEEP DISORDER-UNSPEC 10/21/2009  . FATIGUE 10/21/2009  . Memory loss 02/25/2008  . FACIAL FLUSHING 10/21/2009  . Dysuria 04/12/2010  . COLONIC POLYPS, HX OF 02/25/2008  . DIVERTICULOSIS, COLON 09/02/2010   Past Surgical History  Procedure Date  . Cholecystectomy 1970    s/p  . Appendectomy 1970  . Abdominal hysterectomy 1985  . S/p bilat bunionectomy 2005  . S/p right mastoidectomy     at 72 mo old    reports that she has quit smoking. She does not have any smokeless tobacco history on file. She reports that she does not drink alcohol. Her drug history not on file. family history includes Cancer in her brother and sister; Diabetes in her sister; and Hyperlipidemia in her brother. No Known Allergies Current Outpatient Prescriptions on File Prior to Visit  Medication Sig Dispense Refill  . aspirin 81 MG EC tablet Take 81 mg by mouth daily.        . calcium citrate-vitamin D (CITRACAL+D) 315-200 MG-UNIT  per tablet Take 1 tablet by mouth 2 (two) times daily.        . Cranberry-Vitamin C-Vitamin E (CRANBERRY PLUS VITAMIN C) 140-100-3 MG-MG-UNIT CAPS Take by mouth daily.        Marland Kitchen donepezil (ARICEPT) 10 MG tablet Take 10 mg by mouth daily.        . Garlic (GARLIQUE) 400 MG TBEC Take by mouth daily.        . Multiple Vitamin (MULTIVITAMIN) capsule Take 1 capsule by mouth daily.        . Nutritional Supplements (ESTROVEN) TABS Take by mouth daily.        . Omega-3 Fatty Acids (FISH OIL) 1000 MG CAPS Take by mouth daily.        . Psyllium-Calcium (METAMUCIL PLUS CALCIUM) CAPS Take by mouth. Use as directed       . rosuvastatin (CRESTOR) 40 MG tablet Take 40 mg by mouth. 1/2 by mouth once daily       . zolpidem (AMBIEN) 5 MG tablet Take 1 tablet (5 mg total) by mouth at bedtime as needed for sleep.  30 tablet  5   Review of Systems Review of Systems  Constitutional: Negative for diaphoresis and unexpected weight change.  HENT: Negative for drooling and tinnitus.   Eyes: Negative for photophobia and visual disturbance.  Respiratory: Negative for choking and stridor.   Gastrointestinal: Negative for vomiting and blood in stool. .       Objective:  Physical Exam BP 122/72  Pulse 71  Temp(Src) 97.9 F (36.6 C) (Oral)  Ht 5\' 5"  (1.651 m)  Wt 149 lb (67.586 kg)  BMI 24.79 kg/m2  SpO2 96% Physical Exam  VS noted, not ill appearing Constitutional: Pt appears well-developed and well-nourished.  HENT: Head: Normocephalic.  Right Ear: External ear normal.  Left Ear: External ear normal.  Eyes: Conjunctivae and EOM are normal. Pupils are equal, round, and reactive to light.  Neck: Normal range of motion. Neck supple.  Cardiovascular: Normal rate and regular rhythm.   Pulmonary/Chest: Effort normal and breath sounds normal.  Abd:  Soft, NT, non-distended, + BS, no flank tender Neurological: Pt is alert. No cranial nerve deficit.  Skin: Skin is warm. No erythema.  Psychiatric: Pt behavior is  normal.      Assessment & Plan:

## 2011-02-02 NOTE — Assessment & Plan Note (Signed)
stable overall by hx and exam, most recent data reviewed with pt, and pt to continue medical treatment as before  BP Readings from Last 3 Encounters:  02/02/11 122/72  01/28/11 120/80  12/07/10 120/70

## 2011-02-02 NOTE — Assessment & Plan Note (Signed)
Clinical dx as pt unable to give specimen;  Will tx with cephalexin course - per emr,  to f/u any worsening symptoms or concerns

## 2011-02-02 NOTE — Patient Instructions (Signed)
Take all new medications as prescribed Continue all other medications as before  

## 2011-02-21 ENCOUNTER — Telehealth: Payer: Self-pay | Admitting: Internal Medicine

## 2011-02-21 NOTE — Telephone Encounter (Signed)
Please let daughter know we can start low dose risperidone which can be quite helpful for these symptoms, BUT has been noted to have possibly assoc with slightly higher risk of stroke (the reason is not known)  An alternative can be lower dose xanax, but this will not prevent hallucinations in the future (that the other med can) if this starts  Also, please mention we cannot currently communicate by email with our current EMR, as she suggested in her letter

## 2011-02-21 NOTE — Telephone Encounter (Signed)
I received a letter from daughter Rita Jones regarding mother's worsening ST memory loss, anger, frustration, sleep difficulty  Do we have her listed as ok with HIPAA to talk to for this pt?

## 2011-02-21 NOTE — Telephone Encounter (Signed)
Daughter Rita Jones is listed on pt's HIPAA as of 10/22/2009

## 2011-02-21 NOTE — Telephone Encounter (Signed)
Female that answered advised that pt is unavailable at this time. Advised to call back tomorrow morning.

## 2011-02-22 ENCOUNTER — Ambulatory Visit: Payer: Medicare HMO | Admitting: Internal Medicine

## 2011-02-22 NOTE — Telephone Encounter (Signed)
Left message on machine for pt to return my call  

## 2011-02-24 NOTE — Telephone Encounter (Signed)
Pt's daughter advised and will think about medication options and call back or schedule OV to discuss.

## 2011-03-10 ENCOUNTER — Ambulatory Visit: Payer: Medicare HMO | Admitting: Internal Medicine

## 2011-03-29 ENCOUNTER — Other Ambulatory Visit: Payer: Self-pay | Admitting: Internal Medicine

## 2011-04-07 ENCOUNTER — Ambulatory Visit (HOSPITAL_COMMUNITY)
Admission: RE | Admit: 2011-04-07 | Discharge: 2011-04-07 | Disposition: A | Discharge status: Home / Self Care | Payer: Medicare HMO | Source: Ambulatory Visit | Attending: Internal Medicine | Admitting: Internal Medicine

## 2011-04-07 DIAGNOSIS — Z1231 Encounter for screening mammogram for malignant neoplasm of breast: Secondary | ICD-10-CM | POA: Insufficient documentation

## 2011-05-03 ENCOUNTER — Other Ambulatory Visit: Payer: Self-pay | Admitting: Internal Medicine

## 2011-05-03 ENCOUNTER — Ambulatory Visit (INDEPENDENT_AMBULATORY_CARE_PROVIDER_SITE_OTHER): Payer: Medicare HMO | Admitting: Internal Medicine

## 2011-05-03 ENCOUNTER — Encounter: Payer: Self-pay | Admitting: Internal Medicine

## 2011-05-03 ENCOUNTER — Other Ambulatory Visit (INDEPENDENT_AMBULATORY_CARE_PROVIDER_SITE_OTHER): Payer: Medicare HMO

## 2011-05-03 VITALS — BP 120/70 | HR 57 | Temp 98.0°F | Ht 64.0 in | Wt 145.8 lb

## 2011-05-03 DIAGNOSIS — Z23 Encounter for immunization: Secondary | ICD-10-CM

## 2011-05-03 DIAGNOSIS — Z0000 Encounter for general adult medical examination without abnormal findings: Secondary | ICD-10-CM

## 2011-05-03 DIAGNOSIS — Z79899 Other long term (current) drug therapy: Secondary | ICD-10-CM

## 2011-05-03 DIAGNOSIS — G4700 Insomnia, unspecified: Secondary | ICD-10-CM

## 2011-05-03 LAB — CBC WITH DIFFERENTIAL/PLATELET
Basophils Relative: 0.5 % (ref 0.0–3.0)
Eosinophils Absolute: 0.2 10*3/uL (ref 0.0–0.7)
Hemoglobin: 15.7 g/dL — ABNORMAL HIGH (ref 12.0–15.0)
Lymphocytes Relative: 32.6 % (ref 12.0–46.0)
MCHC: 32.7 g/dL (ref 30.0–36.0)
Monocytes Relative: 9.2 % (ref 3.0–12.0)
Neutro Abs: 4.3 10*3/uL (ref 1.4–7.7)
RBC: 5.18 Mil/uL — ABNORMAL HIGH (ref 3.87–5.11)

## 2011-05-03 LAB — URINALYSIS, ROUTINE W REFLEX MICROSCOPIC
Specific Gravity, Urine: 1.01 (ref 1.000–1.030)
Total Protein, Urine: NEGATIVE
Urine Glucose: NEGATIVE

## 2011-05-03 LAB — LDL CHOLESTEROL, DIRECT: Direct LDL: 112.9 mg/dL

## 2011-05-03 LAB — LIPID PANEL
Total CHOL/HDL Ratio: 2
VLDL: 13.6 mg/dL (ref 0.0–40.0)

## 2011-05-03 LAB — BASIC METABOLIC PANEL
Chloride: 103 mEq/L (ref 96–112)
Creatinine, Ser: 0.9 mg/dL (ref 0.4–1.2)
Potassium: 4.9 mEq/L (ref 3.5–5.1)

## 2011-05-03 LAB — HEPATIC FUNCTION PANEL
Bilirubin, Direct: 0.1 mg/dL (ref 0.0–0.3)
Total Protein: 7.3 g/dL (ref 6.0–8.3)

## 2011-05-03 MED ORDER — CETIRIZINE HCL 5 MG PO TABS: 5.0000 mg | ORAL_TABLET | Freq: Every day | ORAL | Status: DC

## 2011-05-03 MED ORDER — ZOLPIDEM TARTRATE 5 MG PO TABS: 5.0000 mg | ORAL_TABLET | Freq: Every evening | ORAL | Status: DC | PRN

## 2011-05-03 MED ORDER — ROSUVASTATIN CALCIUM 40 MG PO TABS: 40.0000 mg | ORAL_TABLET | Freq: Every day | ORAL | Status: DC

## 2011-05-03 MED ORDER — DONEPEZIL HCL 10 MG PO TABS: 10.0000 mg | ORAL_TABLET | Freq: Every day | ORAL | Status: DC

## 2011-05-03 MED ORDER — IRBESARTAN 150 MG PO TABS: 150.0000 mg | ORAL_TABLET | Freq: Every day | ORAL | Status: DC

## 2011-05-03 NOTE — Patient Instructions (Addendum)
Take all new medications as prescribed Continue all other medications as before Please go to LAB in the Basement for the blood and/or urine tests to be done today Please call the phone number [phone] (the PhoneTree System) for results of testing in 2-3 days;  When calling, simply dial the number, and when prompted enter the MRN number above (the Medical Record Number) and the # key, then the message should start. All of your prescription medications were sent to the pharmacy, except for the sleep med Remus Loffler) which has to be taken in hand to the pharmacy to be filled Please return in 6 months, or sooner if needed

## 2011-05-03 NOTE — Progress Notes (Signed)
Subjective:    Patient ID: Rita Jones, female    DOB: 17-Jun-1931, 77 y.o.   MRN: [id number]  HPI  Here for wellness and f/u;  Overall doing ok;  Pt denies CP, worsening SOB, DOE, wheezing, orthopnea, PND, worsening LE edema, palpitations, dizziness or syncope.  Pt denies neurological change such as new Headache, facial or extremity weakness.  Pt denies polydipsia, polyuria, or low sugar symptoms. Pt states overall good compliance with treatment and medications, good tolerability, and trying to follow lower cholesterol diet.  Pt denies worsening depressive symptoms, suicidal ideation or panic. No fever, wt loss, night sweats, loss of appetite, or other constitutional symptoms.  Pt states good ability with ADL's, low fall risk, home safety reviewed and adequate, no significant changes in hearing or vision, and very active with exercise - going to the gym 5 times per wk in the AM.  Has ongoing memory dysfunction, daughter lives her and pt states she talks to her 4 cats more than her, and so sometimes gets irritated with her.  Dementia overall stable symptomatically with gradual worsening at best, and not assoc with behavioral changes such as hallucinations, paranoia, or agitation per pt.  Has spotty compliacne with the aricept,  But overall takes her meds fairly well. Past Medical History  Diagnosis Date  . HYPERLIPIDEMIA 10/26/2007  . DEMENTIA 08/27/2009  . HYPERTENSION 09/01/2008  . UTI 05/18/2009  . MENOPAUSAL DISORDER 02/25/2010  . OSTEOPENIA 02/25/2008  . INSOMNIA-SLEEP DISORDER-UNSPEC 10/21/2009  . FATIGUE 10/21/2009  . Memory loss 02/25/2008  . FACIAL FLUSHING 10/21/2009  . Dysuria 04/12/2010  . COLONIC POLYPS, HX OF 02/25/2008  . DIVERTICULOSIS, COLON 09/02/2010   Past Surgical History  Procedure Date  . Cholecystectomy 1970    s/p  . Appendectomy 1970  . Abdominal hysterectomy 1985  . S/p bilat bunionectomy 2005  . S/p right mastoidectomy     at 74 mo old    reports that she has quit  smoking. She does not have any smokeless tobacco history on file. She reports that she does not drink alcohol. Her drug history not on file. family history includes Cancer in her brother and sister; Diabetes in her sister; and Hyperlipidemia in her brother. No Known Allergies Current Outpatient Prescriptions on File Prior to Visit  Medication Sig Dispense Refill  . aspirin 81 MG EC tablet Take 81 mg by mouth daily.        . calcium citrate-vitamin D (CITRACAL+D) 315-200 MG-UNIT per tablet Take 1 tablet by mouth 2 (two) times daily.        . Cranberry-Vitamin C-Vitamin E (CRANBERRY PLUS VITAMIN C) 140-100-3 MG-MG-UNIT CAPS Take by mouth daily.        . CRESTOR 40 MG tablet TAKE ONE TABLET BY MOUTH EVERY DAY  90 each  3  . Garlic (GARLIQUE) 400 MG TBEC Take by mouth daily.        . Multiple Vitamin (MULTIVITAMIN) capsule Take 1 capsule by mouth daily.        . Nutritional Supplements (ESTROVEN) TABS Take by mouth daily.        . Omega-3 Fatty Acids (FISH OIL) 1000 MG CAPS Take by mouth daily.        Marland Kitchen zolpidem (AMBIEN) 5 MG tablet Take 1 tablet (5 mg total) by mouth at bedtime as needed for sleep.  30 tablet  5  . donepezil (ARICEPT) 10 MG tablet TAKE ONE TABLET BY MOUTH EVERY DAY  90 tablet  3  . Psyllium-Calcium (METAMUCIL  PLUS CALCIUM) CAPS Take by mouth. Use as directed        Review of Systems Review of Systems  Constitutional: Negative for diaphoresis, activity change, appetite change and unexpected weight change.  HENT: Negative for hearing loss, ear pain, facial swelling, mouth sores and neck stiffness.   Eyes: Negative for pain, redness and visual disturbance.  Respiratory: Negative for shortness of breath and wheezing.   Cardiovascular: Negative for chest pain and palpitations.  Gastrointestinal: Negative for diarrhea, blood in stool, abdominal distention and rectal pain.  Genitourinary: Negative for hematuria, flank pain and decreased urine volume.  Musculoskeletal: Negative for  myalgias and joint swelling.  Skin: Negative for color change and wound.  Neurological: Negative for syncope and numbness.  Hematological: Negative for adenopathy.  Psychiatric/Behavioral: Negative for hallucinations, self-injury, decreased concentration and agitation.     Objective:   Physical Exam BP 120/70  Pulse 57  Temp(Src) 98 F (36.7 C) (Oral)  Ht 5\' 4"  (1.626 m)  Wt 145 lb 12 oz (66.112 kg)  BMI 25.02 kg/m2  SpO2 96% Physical Exam  VS noted Constitutional: Pt is oriented to person, place, and time. Appears well-developed and well-nourished.  HENT:  Head: Normocephalic and atraumatic.  Right Ear: External ear normal.  Left Ear: External ear normal.  Nose: Nose normal.  Mouth/Throat: Oropharynx is clear and moist.  Eyes: Conjunctivae and EOM are normal. Pupils are equal, round, and reactive to light.  Neck: Normal range of motion. Neck supple. No JVD present. No tracheal deviation present.  Cardiovascular: Normal rate, regular rhythm, normal heart sounds and intact distal pulses.   Pulmonary/Chest: Effort normal and breath sounds normal.  Abdominal: Soft. Bowel sounds are normal. There is no tenderness.  Musculoskeletal: Normal range of motion. Exhibits no edema.  Lymphadenopathy:  Has no cervical adenopathy.  Neurological: Pt is alert and oriented to person, place, and time. Pt has normal reflexes. No cranial nerve deficit.  Skin: Skin is warm and dry. No rash noted.  Psychiatric:  Has  normal mood and affect. Behavior is normal.     Assessment & Plan:

## 2011-05-03 NOTE — Assessment & Plan Note (Signed)

## 2011-05-03 NOTE — Progress Notes (Signed)
Addended by: Scharlene Gloss B on: 05/03/2011 09:00 AM   Modules accepted: Orders

## 2011-06-03 ENCOUNTER — Ambulatory Visit: Payer: Medicare HMO | Admitting: Internal Medicine

## 2011-06-03 DIAGNOSIS — Z0289 Encounter for other administrative examinations: Secondary | ICD-10-CM

## 2011-07-04 ENCOUNTER — Ambulatory Visit (INDEPENDENT_AMBULATORY_CARE_PROVIDER_SITE_OTHER): Payer: Medicare HMO | Admitting: Internal Medicine

## 2011-07-04 ENCOUNTER — Encounter: Payer: Self-pay | Admitting: Internal Medicine

## 2011-07-04 ENCOUNTER — Other Ambulatory Visit (INDEPENDENT_AMBULATORY_CARE_PROVIDER_SITE_OTHER): Payer: Medicare HMO

## 2011-07-04 VITALS — BP 110/70 | HR 71 | Temp 98.0°F | Ht 64.0 in | Wt 150.1 lb

## 2011-07-04 DIAGNOSIS — R300 Dysuria: Secondary | ICD-10-CM

## 2011-07-04 DIAGNOSIS — E785 Hyperlipidemia, unspecified: Secondary | ICD-10-CM

## 2011-07-04 DIAGNOSIS — I10 Essential (primary) hypertension: Secondary | ICD-10-CM

## 2011-07-04 DIAGNOSIS — F068 Other specified mental disorders due to known physiological condition: Secondary | ICD-10-CM

## 2011-07-04 DIAGNOSIS — Z0000 Encounter for general adult medical examination without abnormal findings: Secondary | ICD-10-CM

## 2011-07-04 LAB — POCT URINALYSIS DIPSTICK
Nitrite, UA: NEGATIVE
Protein, UA: NEGATIVE
pH, UA: 6.5

## 2011-07-04 LAB — URINALYSIS, ROUTINE W REFLEX MICROSCOPIC
Nitrite: NEGATIVE
Specific Gravity, Urine: 1.02 (ref 1.000–1.030)
Urine Glucose: NEGATIVE
Urobilinogen, UA: 0.2 (ref 0.0–1.0)

## 2011-07-04 MED ORDER — CEPHALEXIN 500 MG PO CAPS: 500.0000 mg | ORAL_CAPSULE | Freq: Four times a day (QID) | ORAL | Status: AC

## 2011-07-04 NOTE — Assessment & Plan Note (Signed)
udip and hx/exam c/w prob cystitis, for urine cx, and antibix course - cephalexin asd

## 2011-07-04 NOTE — Assessment & Plan Note (Signed)
stable overall by hx and exam, most recent data reviewed with pt, and pt to continue medical treatment as before  Lab Results  Component Value Date   WBC 7.8 05/03/2011   HGB 15.7* 05/03/2011   HCT 47.8* 05/03/2011   PLT 218.0 05/03/2011   GLUCOSE 87 05/03/2011   CHOL 231* 05/03/2011   TRIG 68.0 05/03/2011   HDL 104.00 05/03/2011   LDLDIRECT 112.9 05/03/2011   LDLCALC 96 02/18/2010   ALT 36* 05/03/2011   AST 51* 05/03/2011   NA 141 05/03/2011   K 4.9 05/03/2011   CL 103 05/03/2011   CREATININE 0.9 05/03/2011   BUN 17 05/03/2011   CO2 31 05/03/2011   TSH 2.45 05/03/2011

## 2011-07-04 NOTE — Patient Instructions (Addendum)
Take all new medications as prescribed - the antibiotic Continue all other medications as before Please follow lower cholesterol diet We will send the urine sample for culture Please call the phone number [phone] (the PhoneTree System) for results of testing in 2-3 days;  When calling, simply dial the number, and when prompted enter the MRN number above (the Medical Record Number) and the # key, then the message should start. Please return in 9 months with Lab testing done 3-5 days before

## 2011-07-04 NOTE — Assessment & Plan Note (Signed)
stable overall by hx and exam, most recent data reviewed with pt, and pt to continue medical treatment as before  BP Readings from Last 3 Encounters:  07/04/11 110/70  05/03/11 120/70  02/02/11 122/72

## 2011-07-04 NOTE — Progress Notes (Signed)
Subjective:    Patient ID: Rita Jones, female    DOB: 11/06/1930, 77 y.o.   MRN: [id number]  HPI Here to f/u; overall doing ok except for 1 wk onset dysuria, with urgency, freq and lower abd discomfort, but no n/v, flank pain, hematuria, fever, chills.  Last UTI remotely.  Pt denies chest pain, increased sob or doe, wheezing, orthopnea, PND, increased LE swelling, palpitations, dizziness or syncope.  Pt denies new neurological symptoms such as new headache, or facial or extremity weakness or numbness   Pt denies polydipsia, polyuria.  Admits to not following lower chol diet over the holidays so much.  Dementia overall stable symptomatically with gradual worsening at best, and not assoc with behavioral changes such as hallucinations, paranoia, or agitation. Past Medical History  Diagnosis Date  . HYPERLIPIDEMIA 10/26/2007  . DEMENTIA 08/27/2009  . HYPERTENSION 09/01/2008  . UTI 05/18/2009  . MENOPAUSAL DISORDER 02/25/2010  . OSTEOPENIA 02/25/2008  . INSOMNIA-SLEEP DISORDER-UNSPEC 10/21/2009  . FATIGUE 10/21/2009  . Memory loss 02/25/2008  . FACIAL FLUSHING 10/21/2009  . Dysuria 04/12/2010  . COLONIC POLYPS, HX OF 02/25/2008  . DIVERTICULOSIS, COLON 09/02/2010   Past Surgical History  Procedure Date  . Cholecystectomy 1970    s/p  . Appendectomy 1970  . Abdominal hysterectomy 1985  . S/p bilat bunionectomy 2005  . S/p right mastoidectomy     at 69 mo old    reports that she has quit smoking. She does not have any smokeless tobacco history on file. She reports that she does not drink alcohol. Her drug history not on file. family history includes Cancer in her brother and sister; Diabetes in her sister; and Hyperlipidemia in her brother. No Known Allergies Current Outpatient Prescriptions on File Prior to Visit  Medication Sig Dispense Refill  . aspirin 81 MG EC tablet Take 81 mg by mouth daily.        . calcium citrate-vitamin D (CITRACAL+D) 315-200 MG-UNIT per tablet Take 1 tablet by mouth 2  (two) times daily.        . cetirizine (ZYRTEC) 5 MG tablet Take 1 tablet (5 mg total) by mouth daily.  90 tablet  3  . Cranberry-Vitamin C-Vitamin E (CRANBERRY PLUS VITAMIN C) 140-100-3 MG-MG-UNIT CAPS Take by mouth daily.        Marland Kitchen donepezil (ARICEPT) 10 MG tablet Take 1 tablet (10 mg total) by mouth daily.  90 tablet  3  . Garlic (GARLIQUE) 400 MG TBEC Take by mouth daily.        . irbesartan (AVAPRO) 150 MG tablet Take 1 tablet (150 mg total) by mouth at bedtime.  90 tablet  3  . Multiple Vitamin (MULTIVITAMIN) capsule Take 1 capsule by mouth daily.        . Nutritional Supplements (ESTROVEN) TABS Take by mouth daily.        . Omega-3 Fatty Acids (FISH OIL) 1000 MG CAPS Take by mouth daily.        . Psyllium-Calcium (METAMUCIL PLUS CALCIUM) CAPS Take by mouth. Use as directed       . rosuvastatin (CRESTOR) 40 MG tablet Take 1 tablet (40 mg total) by mouth daily.  90 tablet  3  . zolpidem (AMBIEN) 5 MG tablet Take 1 tablet (5 mg total) by mouth at bedtime as needed for sleep.  30 tablet  5   Review of Systems Review of Systems  Constitutional: Negative for diaphoresis and unexpected weight change.  HENT: Negative for drooling and tinnitus.  Eyes: Negative for photophobia and visual disturbance.  Respiratory: Negative for choking and stridor.   Gastrointestinal: Negative for vomiting and blood in stool.  Genitourinary: Negative for hematuria and decreased urine volume.    Objective:   Physical Exam BP 110/70  Pulse 71  Temp(Src) 98 F (36.7 C) (Oral)  Ht 5\' 4"  (1.626 m)  Wt 150 lb 2 oz (68.096 kg)  BMI 25.77 kg/m2  SpO2 97% Physical Exam  VS noted, not ill appearing, confusion at baseline forher Constitutional: Pt appears well-developed and well-nourished.  HENT: Head: Normocephalic.  Right Ear: External ear normal.  Left Ear: External ear normal.  Eyes: Conjunctivae and EOM are normal. Pupils are equal, round, and reactive to light.  Neck: Normal range of motion. Neck supple.    Cardiovascular: Normal rate and regular rhythm.   Pulmonary/Chest: Effort normal and breath sounds normal.  Abd:  Soft, NT, non-distended, + BS except for mild low mid abd tender without guarding/rebound Neurological: Pt is alert. No cranial nerve deficit.  Skin: Skin is warm. No erythema.  Psychiatric: Pt behavior is normal.    Assessment & Plan:

## 2011-07-04 NOTE — Assessment & Plan Note (Signed)
stable overall by hx and exam, most recent data reviewed with pt, and pt to continue medical treatment as before, for lower chol diet, declines statin  Lab Results  Component Value Date   LDLCALC 96 02/18/2010

## 2011-07-08 LAB — URINE CULTURE

## 2011-09-13 ENCOUNTER — Telehealth: Payer: Self-pay

## 2011-09-13 NOTE — Telephone Encounter (Signed)
Patient left message on Monday 09/12/11. States she has bladder infection and would like antibiotic called to pharmacy, please advise

## 2011-09-13 NOTE — Telephone Encounter (Signed)
Needs OV please - ok to work in

## 2011-09-13 NOTE — Telephone Encounter (Signed)
Patient informed and will call for same day appointment on 09/14/11.

## 2011-09-20 ENCOUNTER — Other Ambulatory Visit: Payer: Medicare HMO

## 2011-09-20 ENCOUNTER — Ambulatory Visit (INDEPENDENT_AMBULATORY_CARE_PROVIDER_SITE_OTHER): Payer: Medicare HMO | Admitting: Internal Medicine

## 2011-09-20 ENCOUNTER — Encounter: Payer: Self-pay | Admitting: Internal Medicine

## 2011-09-20 VITALS — BP 132/80 | HR 60 | Temp 97.0°F | Ht 64.0 in | Wt 153.0 lb

## 2011-09-20 DIAGNOSIS — F068 Other specified mental disorders due to known physiological condition: Secondary | ICD-10-CM

## 2011-09-20 DIAGNOSIS — R300 Dysuria: Secondary | ICD-10-CM

## 2011-09-20 DIAGNOSIS — I10 Essential (primary) hypertension: Secondary | ICD-10-CM

## 2011-09-20 LAB — POCT URINALYSIS DIPSTICK
Bilirubin, UA: NEGATIVE
Ketones, UA: NEGATIVE
Spec Grav, UA: 1.01

## 2011-09-20 MED ORDER — CEPHALEXIN 500 MG PO CAPS: 500.0000 mg | ORAL_CAPSULE | Freq: Four times a day (QID) | ORAL | Status: AC

## 2011-09-20 NOTE — Patient Instructions (Signed)
Take all new medications as prescribed Continue all other medications as before Your urine sample will be sent for culture Please call the phone number [phone] (the PhoneTree System) for results of testing in 2-3 days;  When calling, simply dial the number, and when prompted enter the MRN number above (the Medical Record Number) and the # key, then the message should start.  

## 2011-09-24 ENCOUNTER — Other Ambulatory Visit: Payer: Self-pay | Admitting: Internal Medicine

## 2011-09-24 LAB — URINE CULTURE

## 2011-09-24 MED ORDER — CIPROFLOXACIN HCL 500 MG PO TABS: 500.0000 mg | ORAL_TABLET | Freq: Two times a day (BID) | ORAL | Status: AC

## 2011-09-24 NOTE — Telephone Encounter (Signed)
Robin to contact pt  Urine cx shows 2 different bugs causing UTI, with cephelaxin not working for one of them  OK to change to cipro course - done per [employer]

## 2011-09-25 ENCOUNTER — Encounter: Payer: Self-pay | Admitting: Internal Medicine

## 2011-09-25 NOTE — Progress Notes (Signed)
Subjective:    Patient ID: Rita Jones, female    DOB: [birth year], 78 y.o.   MRN: [id number]  HPI  Here with 1 wk onset midl to mod grad worseing dysuria and frequency, but no urgency,or hematuria, f/c, back pain, n/v, rash, chills.  Pt denies chest pain, increased sob or doe, wheezing, orthopnea, PND, increased LE swelling, palpitations, dizziness or syncope.  Pt denies new neurological symptoms such as new headache, or facial or extremity weakness or numbness   Pt denies polydipsia, polyuria.  Dementia overall stable symptomatically with gradual worsening at best, and not assoc with behavioral changes such as hallucinations, paranoia, or agitation. No worsening confusion it seems with current illness Past Medical History  Diagnosis Date  . HYPERLIPIDEMIA 10/26/2007  . DEMENTIA 08/27/2009  . HYPERTENSION 09/01/2008  . UTI 05/18/2009  . MENOPAUSAL DISORDER 02/25/2010  . OSTEOPENIA 02/25/2008  . INSOMNIA-SLEEP DISORDER-UNSPEC 10/21/2009  . FATIGUE 10/21/2009  . Memory loss 02/25/2008  . FACIAL FLUSHING 10/21/2009  . Dysuria 04/12/2010  . COLONIC POLYPS, HX OF 02/25/2008  . DIVERTICULOSIS, COLON 09/02/2010   Past Surgical History  Procedure Date  . Cholecystectomy 1970    s/p  . Appendectomy 1970  . Abdominal hysterectomy 1985  . S/p bilat bunionectomy 2005  . S/p right mastoidectomy     at 34 mo old    reports that she has quit smoking. She does not have any smokeless tobacco history on file. She reports that she does not drink alcohol. Her drug history not on file. family history includes Cancer in her brother and sister; Diabetes in her sister; and Hyperlipidemia in her brother. No Known Allergies Current Outpatient Prescriptions on File Prior to Visit  Medication Sig Dispense Refill  . aspirin 81 MG EC tablet Take 81 mg by mouth daily.        . calcium citrate-vitamin D (CITRACAL+D) 315-200 MG-UNIT per tablet Take 1 tablet by mouth 2 (two) times daily.        . cetirizine (ZYRTEC) 5 MG  tablet Take 1 tablet (5 mg total) by mouth daily.  90 tablet  3  . Cranberry-Vitamin C-Vitamin E (CRANBERRY PLUS VITAMIN C) 140-100-3 MG-MG-UNIT CAPS Take by mouth daily.        Marland Kitchen donepezil (ARICEPT) 10 MG tablet Take 1 tablet (10 mg total) by mouth daily.  90 tablet  3  . Garlic (GARLIQUE) 400 MG TBEC Take by mouth daily.        . irbesartan (AVAPRO) 150 MG tablet Take 1 tablet (150 mg total) by mouth at bedtime.  90 tablet  3  . Multiple Vitamin (MULTIVITAMIN) capsule Take 1 capsule by mouth daily.        . Nutritional Supplements (ESTROVEN) TABS Take by mouth daily.        . Omega-3 Fatty Acids (FISH OIL) 1000 MG CAPS Take by mouth daily.        . Psyllium-Calcium (METAMUCIL PLUS CALCIUM) CAPS Take by mouth. Use as directed       . rosuvastatin (CRESTOR) 40 MG tablet Take 1 tablet (40 mg total) by mouth daily.  90 tablet  3  . zolpidem (AMBIEN) 5 MG tablet Take 1 tablet (5 mg total) by mouth at bedtime as needed for sleep.  30 tablet  5   Review of Systems Review of Systems  Constitutional: Negative for diaphoresis and unexpected weight change.  HENT: Negative for drooling and tinnitus.   Eyes: Negative for photophobia and visual disturbance.  Respiratory: Negative for  choking and stridor.   Gastrointestinal: Negative for vomiting and blood in stool.  Genitourinary: Negative for hematuria and decreased urine volume.    Objective:   Physical Exam BP 132/80  Pulse 60  Temp(Src) 97 F (36.1 C) (Oral)  Ht 5\' 4"  (1.626 m)  Wt 153 lb (69.4 kg)  BMI 26.26 kg/m2  SpO2 96% Physical Exam  VS noted, mild ill Constitutional: Pt appears well-developed and well-nourished.  HENT: Head: Normocephalic.  Right Ear: External ear normal.  Left Ear: External ear normal.  Eyes: Conjunctivae and EOM are normal. Pupils are equal, round, and reactive to light.  Neck: Normal range of motion. Neck supple.  Cardiovascular: Normal rate and regular rhythm.   Pulmonary/Chest: Effort normal and breath  sounds normal.  Abd:  Soft, non-distended, + BS, with low mid abd tender, no guarding or rebound Neurological: Pt is alert,  Skin: Skin is warm. No erythema.  Psychiatric: Pt behavior is normal. Thought content c/w dementia    Assessment & Plan:

## 2011-09-25 NOTE — Assessment & Plan Note (Signed)
stable overall by hx and exam, most recent data reviewed with pt, and pt to continue medical treatment as before  Lab Results  Component Value Date   WBC 7.8 05/03/2011   HGB 15.7* 05/03/2011   HCT 47.8* 05/03/2011   PLT 218.0 05/03/2011   GLUCOSE 87 05/03/2011   CHOL 231* 05/03/2011   TRIG 68.0 05/03/2011   HDL 104.00 05/03/2011   LDLDIRECT 112.9 05/03/2011   LDLCALC 96 02/18/2010   ALT 36* 05/03/2011   AST 51* 05/03/2011   NA 141 05/03/2011   K 4.9 05/03/2011   CL 103 05/03/2011   CREATININE 0.9 05/03/2011   BUN 17 05/03/2011   CO2 31 05/03/2011   TSH 2.45 05/03/2011    

## 2011-09-25 NOTE — Assessment & Plan Note (Signed)
Mild to mod, for antibx course,  to f/u any worsening symptoms or concerns, UA dip abnormal, reviewed with pt today

## 2011-09-25 NOTE — Assessment & Plan Note (Signed)
stable overall by hx and exam, most recent data reviewed with pt, and pt to continue medical treatment as before  BP Readings from Last 3 Encounters:  09/20/11 132/80  07/04/11 110/70  05/03/11 120/70

## 2011-11-01 ENCOUNTER — Encounter: Payer: Self-pay | Admitting: Internal Medicine

## 2011-11-01 ENCOUNTER — Ambulatory Visit (INDEPENDENT_AMBULATORY_CARE_PROVIDER_SITE_OTHER): Payer: Medicare HMO | Admitting: Internal Medicine

## 2011-11-01 VITALS — BP 110/62 | HR 60 | Temp 97.2°F | Ht 66.0 in | Wt 153.5 lb

## 2011-11-01 DIAGNOSIS — E785 Hyperlipidemia, unspecified: Secondary | ICD-10-CM

## 2011-11-01 DIAGNOSIS — R300 Dysuria: Secondary | ICD-10-CM

## 2011-11-01 DIAGNOSIS — I10 Essential (primary) hypertension: Secondary | ICD-10-CM

## 2011-11-01 DIAGNOSIS — F068 Other specified mental disorders due to known physiological condition: Secondary | ICD-10-CM

## 2011-11-01 NOTE — Assessment & Plan Note (Signed)
stable overall by hx and exam, most recent data reviewed with pt, and pt to continue medical treatment as before  BP Readings from Last 3 Encounters:  11/01/11 110/62  09/20/11 132/80  07/04/11 110/70

## 2011-11-01 NOTE — Patient Instructions (Signed)
Continue all other medications as before Please have the pharmacy call with any refills you may need.

## 2011-11-01 NOTE — Progress Notes (Signed)
Subjective:    Patient ID: Rita Jones, female    DOB: May 17, 1931, 78 y.o.   MRN: [id number]  HPI  Here to f/u; overall doing quite well; reminds me she has an obese twin sister with chronic pain and mult health issues but she in fact  Is pain free, no recent falls, trying to stay active and wt control with going to gym at 5am every day;   Denies urinary symptoms such as dysuria, frequency, urgency,or hematuria.  Denies worsening depressive symptoms, suicidal ideation, or panic, though has ongoing anxiety, not increased recently - "keeping a positive attitude."  Pt denies chest pain, increased sob or doe, wheezing, orthopnea, PND, increased LE swelling, palpitations, dizziness or syncope.  Pt denies new neurological symptoms such as new headache, or facial or extremity weakness or numbness   Pt denies polydipsia, polyuria.  Dementia overall stable symptomatically, and not assoc with behavioral changes such as hallucinations, paranoia, or agitation.    Past Medical History  Diagnosis Date  . HYPERLIPIDEMIA 10/26/2007  . DEMENTIA 08/27/2009  . HYPERTENSION 09/01/2008  . UTI 05/18/2009  . MENOPAUSAL DISORDER 02/25/2010  . OSTEOPENIA 02/25/2008  . INSOMNIA-SLEEP DISORDER-UNSPEC 10/21/2009  . FATIGUE 10/21/2009  . Memory loss 02/25/2008  . FACIAL FLUSHING 10/21/2009  . Dysuria 04/12/2010  . COLONIC POLYPS, HX OF 02/25/2008  . DIVERTICULOSIS, COLON 09/02/2010   Past Surgical History  Procedure Date  . Cholecystectomy 1970    s/p  . Appendectomy 1970  . Abdominal hysterectomy 1985  . S/p bilat bunionectomy 2005  . S/p right mastoidectomy     at 58 mo old    reports that she has quit smoking. She does not have any smokeless tobacco history on file. She reports that she does not drink alcohol. Her drug history not on file. family history includes Cancer in her brother and sister; Diabetes in her sister; and Hyperlipidemia in her brother. No Known Allergies Current Outpatient Prescriptions on File  Prior to Visit  Medication Sig Dispense Refill  . aspirin 81 MG EC tablet Take 81 mg by mouth daily.        . calcium citrate-vitamin D (CITRACAL+D) 315-200 MG-UNIT per tablet Take 1 tablet by mouth 2 (two) times daily.        . cetirizine (ZYRTEC) 5 MG tablet Take 1 tablet (5 mg total) by mouth daily.  90 tablet  3  . Cranberry-Vitamin C-Vitamin E (CRANBERRY PLUS VITAMIN C) 140-100-3 MG-MG-UNIT CAPS Take by mouth daily.        Marland Kitchen donepezil (ARICEPT) 10 MG tablet Take 1 tablet (10 mg total) by mouth daily.  90 tablet  3  . Garlic (GARLIQUE) 400 MG TBEC Take by mouth daily.        . irbesartan (AVAPRO) 150 MG tablet Take 1 tablet (150 mg total) by mouth at bedtime.  90 tablet  3  . Multiple Vitamin (MULTIVITAMIN) capsule Take 1 capsule by mouth daily.        . Nutritional Supplements (ESTROVEN) TABS Take by mouth daily.        . Omega-3 Fatty Acids (FISH OIL) 1000 MG CAPS Take by mouth daily.        . Psyllium-Calcium (METAMUCIL PLUS CALCIUM) CAPS Take by mouth. Use as directed       . rosuvastatin (CRESTOR) 40 MG tablet Take 1 tablet (40 mg total) by mouth daily.  90 tablet  3  . zolpidem (AMBIEN) 5 MG tablet Take 1 tablet (5 mg total) by mouth at  bedtime as needed for sleep.  30 tablet  5   Review of Systems Review of Systems  Constitutional: Negative for diaphoresis and unexpected weight change.  Respiratory: Negative for choking and stridor.   Gastrointestinal: Negative for vomiting and blood in stool.  Genitourinary: Negative for hematuria and decreased urine volume.  Musculoskeletal: Negative for gait problem.  Skin: Negative for color change and wound.  Neurological: Negative for tremors and numbness.  Psychiatric/Behavioral: Negative for decreased concentration. The patient is not hyperactive.       Objective:   Physical Exam BP 110/62  Pulse 60  Temp(Src) 97.2 F (36.2 C) (Oral)  Ht 5\' 6"  (1.676 m)  Wt 153 lb 8 oz (69.627 kg)  BMI 24.78 kg/m2  SpO2 95% Physical Exam  VS  noted Constitutional: Pt appears well-developed and well-nourished.  HENT: Head: Normocephalic.  Right Ear: External ear normal.  Left Ear: External ear normal.  Eyes: Conjunctivae and EOM are normal. Pupils are equal, round, and reactive to light.  Neck: Normal range of motion. Neck supple.  Cardiovascular: Normal rate and regular rhythm.   Pulmonary/Chest: Effort normal and breath sounds normal.  Neurological: Pt is alert. No cranial nerve deficit.  Skin: Skin is warm. No erythema.  Psychiatric: Pt behavior is normal. Thought content c/w mild to mod dementia 1+ nervous    Assessment & Plan:

## 2011-11-01 NOTE — Assessment & Plan Note (Signed)
Recent uti reviewed with pt with e coli adn staph coag neg, s/p successful tx, exam benign, pt reassured, no further eval or tx needed

## 2011-11-01 NOTE — Assessment & Plan Note (Signed)
Long standing issue on high dose crestor;  Tolerating well, to cont med, and lower chol diet,  to f/u any worsening symptoms or concerns Lab Results  Component Value Date   LDLCALC 96 02/18/2010

## 2011-11-01 NOTE — Assessment & Plan Note (Signed)
stable overall by hx and exam, most recent data reviewed with pt, and pt to continue medical treatment as before  Lab Results  Component Value Date   WBC 7.8 05/03/2011   HGB 15.7* 05/03/2011   HCT 47.8* 05/03/2011   PLT 218.0 05/03/2011   GLUCOSE 87 05/03/2011   CHOL 231* 05/03/2011   TRIG 68.0 05/03/2011   HDL 104.00 05/03/2011   LDLDIRECT 112.9 05/03/2011   LDLCALC 96 02/18/2010   ALT 36* 05/03/2011   AST 51* 05/03/2011   NA 141 05/03/2011   K 4.9 05/03/2011   CL 103 05/03/2011   CREATININE 0.9 05/03/2011   BUN 17 05/03/2011   CO2 31 05/03/2011   TSH 2.45 05/03/2011    

## 2011-11-03 ENCOUNTER — Ambulatory Visit (INDEPENDENT_AMBULATORY_CARE_PROVIDER_SITE_OTHER): Payer: Medicare HMO | Admitting: Internal Medicine

## 2011-11-03 ENCOUNTER — Encounter: Payer: Self-pay | Admitting: Internal Medicine

## 2011-11-03 VITALS — BP 132/80 | HR 75 | Temp 97.0°F | Ht 66.0 in | Wt 153.0 lb

## 2011-11-03 DIAGNOSIS — N390 Urinary tract infection, site not specified: Secondary | ICD-10-CM

## 2011-11-03 DIAGNOSIS — I10 Essential (primary) hypertension: Secondary | ICD-10-CM

## 2011-11-03 DIAGNOSIS — F068 Other specified mental disorders due to known physiological condition: Secondary | ICD-10-CM

## 2011-11-03 DIAGNOSIS — R300 Dysuria: Secondary | ICD-10-CM

## 2011-11-03 MED ORDER — CEPHALEXIN 250 MG PO CAPS: 250.0000 mg | ORAL_CAPSULE | Freq: Every day | ORAL | Status: AC

## 2011-11-03 MED ORDER — CEPHALEXIN 500 MG PO CAPS: 500.0000 mg | ORAL_CAPSULE | Freq: Four times a day (QID) | ORAL | Status: AC

## 2011-11-03 NOTE — Progress Notes (Signed)
Subjective:    Patient ID: Rita Jones, female    DOB: 10/12/1930, 78 y.o.   MRN: [id number]  HPI  Here with c/o dysuria and freq for 2-3 days with low abd discomfort, c/w poss uti- would be 4th episode in 9 months, unfort unable to give specimen today, denies fever, HA, n/v, flank pain, hematuria, worsening confusion..  Pt denies chest pain, increased sob or doe, wheezing, orthopnea, PND, increased LE swelling, palpitations, dizziness or syncope.   Pt denies polydipsia, polyuria.Dementia overall stable symptomatically with gradual worsening at best, and not assoc with behavioral changes such as hallucinations, paranoia, or agitation.  Denies worsening depressive symptoms, suicidal ideation, or panic. Past Medical History  Diagnosis Date  . HYPERLIPIDEMIA 10/26/2007  . DEMENTIA 08/27/2009  . HYPERTENSION 09/01/2008  . UTI 05/18/2009  . MENOPAUSAL DISORDER 02/25/2010  . OSTEOPENIA 02/25/2008  . INSOMNIA-SLEEP DISORDER-UNSPEC 10/21/2009  . FATIGUE 10/21/2009  . Memory loss 02/25/2008  . FACIAL FLUSHING 10/21/2009  . Dysuria 04/12/2010  . COLONIC POLYPS, HX OF 02/25/2008  . DIVERTICULOSIS, COLON 09/02/2010   Past Surgical History  Procedure Date  . Cholecystectomy 1970    s/p  . Appendectomy 1970  . Abdominal hysterectomy 1985  . S/p bilat bunionectomy 2005  . S/p right mastoidectomy     at 59 mo old    reports that she has quit smoking. She does not have any smokeless tobacco history on file. She reports that she does not drink alcohol. Her drug history not on file. family history includes Cancer in her brother and sister; Diabetes in her sister; and Hyperlipidemia in her brother. No Known Allergies Current Outpatient Prescriptions on File Prior to Visit  Medication Sig Dispense Refill  . aspirin 81 MG EC tablet Take 81 mg by mouth daily.        . calcium citrate-vitamin D (CITRACAL+D) 315-200 MG-UNIT per tablet Take 1 tablet by mouth 2 (two) times daily.        . cetirizine (ZYRTEC) 5 MG  tablet Take 1 tablet (5 mg total) by mouth daily.  90 tablet  3  . Cranberry-Vitamin C-Vitamin E (CRANBERRY PLUS VITAMIN C) 140-100-3 MG-MG-UNIT CAPS Take by mouth daily.        Marland Kitchen donepezil (ARICEPT) 10 MG tablet Take 1 tablet (10 mg total) by mouth daily.  90 tablet  3  . Garlic (GARLIQUE) 400 MG TBEC Take by mouth daily.        . irbesartan (AVAPRO) 150 MG tablet Take 1 tablet (150 mg total) by mouth at bedtime.  90 tablet  3  . Multiple Vitamin (MULTIVITAMIN) capsule Take 1 capsule by mouth daily.        . Nutritional Supplements (ESTROVEN) TABS Take by mouth daily.        . Omega-3 Fatty Acids (FISH OIL) 1000 MG CAPS Take by mouth daily.        . Psyllium-Calcium (METAMUCIL PLUS CALCIUM) CAPS Take by mouth. Use as directed       . rosuvastatin (CRESTOR) 40 MG tablet Take 1 tablet (40 mg total) by mouth daily.  90 tablet  3  . zolpidem (AMBIEN) 5 MG tablet Take 1 tablet (5 mg total) by mouth at bedtime as needed for sleep.  30 tablet  5   Review of Systems Review of Systems  Constitutional: Negative for diaphoresis and unexpected weight change.  Genitourinary: Negative for hematuria and decreased urine volume.  Musculoskeletal: Negative for gait problem.  Skin: Negative for color change and wound.  Neurological: Negative for tremors and numbness.  Psychiatric/Behavioral: Negative for decreased concentration. The patient is not hyperactive.       Objective:   Physical Exam BP 132/80  Pulse 75  Temp(Src) 97 F (36.1 C) (Oral)  Ht 5\' 6"  (1.676 m)  Wt 153 lb (69.4 kg)  BMI 24.69 kg/m2  SpO2 96% Physical Exam  VS noted, mild ill appaering Constitutional: Pt appears well-developed and well-nourished.  HENT: Head: Normocephalic.  Right Ear: External ear normal.  Left Ear: External ear normal.  Eyes: Conjunctivae and EOM are normal. Pupils are equal, round, and reactive to light.  Neck: Normal range of motion. Neck supple.  Cardiovascular: Normal rate and regular rhythm.     Pulmonary/Chest: Effort normal and breath sounds normal.  Abd:  Soft, NT, non-distended, + BS except mild low mid abd tender without guarding or rebound, no flank tender  Skin: Skin is warm. No erythema.  Psychiatric: Pt behavior is normal. Thought content c/w mod dementia    Assessment & Plan:

## 2011-11-03 NOTE — Assessment & Plan Note (Signed)
stable overall by hx and exam, most recent data reviewed with pt, and pt to continue medical treatment as before  Lab Results  Component Value Date   WBC 7.8 05/03/2011   HGB 15.7* 05/03/2011   HCT 47.8* 05/03/2011   PLT 218.0 05/03/2011   GLUCOSE 87 05/03/2011   CHOL 231* 05/03/2011   TRIG 68.0 05/03/2011   HDL 104.00 05/03/2011   LDLDIRECT 112.9 05/03/2011   LDLCALC 96 02/18/2010   ALT 36* 05/03/2011   AST 51* 05/03/2011   NA 141 05/03/2011   K 4.9 05/03/2011   CL 103 05/03/2011   CREATININE 0.9 05/03/2011   BUN 17 05/03/2011   CO2 31 05/03/2011   TSH 2.45 05/03/2011    

## 2011-11-03 NOTE — Assessment & Plan Note (Signed)
4th episode in 9 months, unable to give specimen this time, exam c/w mild cystitis - for cephalexin tx course, then after for low dose cephalexin nightly for prevention

## 2011-11-03 NOTE — Patient Instructions (Addendum)
Take all new medications as prescribed - the cephalexin 500 mg treatment for 1 week, then the cephalexin 250 mg nightly prevention after that Continue all other medications as before

## 2011-11-03 NOTE — Assessment & Plan Note (Signed)
stable overall by hx and exam, most recent data reviewed with pt, and pt to continue medical treatment as before  BP Readings from Last 3 Encounters:  11/03/11 132/80  11/01/11 110/62  09/20/11 132/80

## 2012-04-03 ENCOUNTER — Ambulatory Visit: Payer: Medicare HMO | Admitting: Internal Medicine

## 2012-04-03 DIAGNOSIS — Z0289 Encounter for other administrative examinations: Secondary | ICD-10-CM

## 2012-05-18 ENCOUNTER — Telehealth: Payer: Self-pay | Admitting: *Deleted

## 2012-05-18 MED ORDER — MEMANTINE HCL 5 MG PO TABS: 5.0000 mg | ORAL_TABLET | Freq: Two times a day (BID) | ORAL | Status: DC

## 2012-05-18 NOTE — Telephone Encounter (Signed)
Called Gladys the congregational nurse informed of rx sent in.

## 2012-05-18 NOTE — Telephone Encounter (Signed)
Ok to start namenda 5 mg bid, this also helps in addition to the generic aricept with memory ability

## 2012-05-18 NOTE — Telephone Encounter (Signed)
Gladys-Congregation Nurse with pt's church called on behalf of pt's daughter. Pt is becoming more forgetful, repeating things, and is also having issues and agitation with daughter and other members of her congregation. Pt had appointment scheduled last month but did not come because she forgot. Daughter is concerned because she feels pt is getting worse and is asking for MD's advisement.

## 2012-06-07 ENCOUNTER — Telehealth: Payer: Self-pay | Admitting: Internal Medicine

## 2012-06-07 NOTE — Telephone Encounter (Signed)
Received letter per Rosezetta Schlatter - daughter/poa -   Pt with worsening symptoms of agitation, paranoia, anger per her letter  ? Ok to start low dose medication such as generic risperdal?  This would help calm the symptoms

## 2012-06-08 NOTE — Telephone Encounter (Signed)
Called the daughter and she has schedule an appointment on wed. 06/13/12, she would like to wait until then to start medication.

## 2012-06-13 ENCOUNTER — Encounter: Payer: Self-pay | Admitting: Internal Medicine

## 2012-06-13 ENCOUNTER — Ambulatory Visit (INDEPENDENT_AMBULATORY_CARE_PROVIDER_SITE_OTHER): Payer: Medicare HMO | Admitting: Internal Medicine

## 2012-06-13 VITALS — BP 132/78 | HR 66 | Temp 97.0°F | Ht 66.5 in | Wt 156.5 lb

## 2012-06-13 DIAGNOSIS — Z0000 Encounter for general adult medical examination without abnormal findings: Secondary | ICD-10-CM

## 2012-06-13 DIAGNOSIS — M949 Disorder of cartilage, unspecified: Secondary | ICD-10-CM

## 2012-06-13 DIAGNOSIS — F329 Major depressive disorder, single episode, unspecified: Secondary | ICD-10-CM

## 2012-06-13 DIAGNOSIS — F068 Other specified mental disorders due to known physiological condition: Secondary | ICD-10-CM

## 2012-06-13 MED ORDER — RISPERIDONE 0.25 MG PO TABS: 0.2500 mg | ORAL_TABLET | Freq: Two times a day (BID) | ORAL | Status: DC

## 2012-06-13 MED ORDER — CITALOPRAM HYDROBROMIDE 10 MG PO TABS: 10.0000 mg | ORAL_TABLET | Freq: Every day | ORAL | Status: DC

## 2012-06-13 MED ORDER — IRBESARTAN 150 MG PO TABS: 150.0000 mg | ORAL_TABLET | Freq: Every day | ORAL | Status: DC

## 2012-06-13 MED ORDER — ROSUVASTATIN CALCIUM 40 MG PO TABS: 40.0000 mg | ORAL_TABLET | Freq: Every day | ORAL | Status: DC

## 2012-06-13 MED ORDER — DONEPEZIL HCL 10 MG PO TABS: 10.0000 mg | ORAL_TABLET | Freq: Every day | ORAL | Status: DC

## 2012-06-13 NOTE — Assessment & Plan Note (Signed)
To check vit D

## 2012-06-13 NOTE — Assessment & Plan Note (Signed)

## 2012-06-13 NOTE — Assessment & Plan Note (Signed)
With gradaul worsening behaviors, to cont aricept, add risperdal asd,  to f/u any worsening symptoms or concerns

## 2012-06-13 NOTE — Patient Instructions (Addendum)
Continue all other medications as before Your medications were all refilled today Take all new medications as prescribed Please go to LAB in the Basement for the blood and/or urine tests to be done today You will be contacted by phone if any changes need to be made immediately.  Otherwise, you will receive a letter about your results with an explanation, but please check with MyChart first. Thank you for enrolling in MyChart. Please follow the instructions below to securely access your online medical record. MyChart allows you to send messages to your doctor, view your test results, renew your prescriptions, schedule appointments, and more. To Log into MyChart, please go to [email or web address], and your Username is:  [email or web address] Please return in 6 months, or sooner if needed

## 2012-06-13 NOTE — Progress Notes (Signed)
Subjective:    Patient ID: Rita Jones, female    DOB: 06/30/1931, 78 y.o.   MRN: [id number]  HPI  Here for wellness and f/u;  Overall doing ok;  Pt denies CP, worsening SOB, DOE, wheezing, orthopnea, PND, worsening LE edema, palpitations, dizziness or syncope.  Pt denies neurological change such as new Headache, facial or extremity weakness.  Pt denies polydipsia, polyuria, or low sugar symptoms. Pt states overall good compliance with treatment and medications, good tolerability, and trying to follow lower cholesterol diet.  Pt denies worsening depressive symptoms, suicidal ideation or panic. No fever, wt loss, night sweats, loss of appetite, or other constitutional symptoms.  Pt states good ability with ADL's, low fall risk, home safety reviewed and adequate, no significant changes in hearing or vision, and occasionally active with exercise. Pt with dementia, no acute complaiints, and denies all ROS.  Daughter with her today relates worsening ST memory with increased delusions, paranoia, general confusion, anger and agitation, all worse at night, and also noted to have worsening depressive symptoms, but no suicidal ideation, or panic. Pt states overall good compliance with meds, trying to follow lower cholesterol diet, wt overall stable. Did trip and fall last with to carpet with abrasion to chin and contusion in the midline, now improved.  Namenda was too expensive, so never took.   Pt denies fever, wt loss, night sweats, loss of appetite, or other constitutional symptoms   Denies urinary symptoms such as dysuria, frequency, urgency,or hematuria. Past Medical History  Diagnosis Date  . HYPERLIPIDEMIA 10/26/2007  . DEMENTIA 08/27/2009  . HYPERTENSION 09/01/2008  . UTI 05/18/2009  . MENOPAUSAL DISORDER 02/25/2010  . OSTEOPENIA 02/25/2008  . INSOMNIA-SLEEP DISORDER-UNSPEC 10/21/2009  . FATIGUE 10/21/2009  . Memory loss 02/25/2008  . FACIAL FLUSHING 10/21/2009  . Dysuria 04/12/2010  . COLONIC POLYPS, HX  OF 02/25/2008  . DIVERTICULOSIS, COLON 09/02/2010   Past Surgical History  Procedure Date  . Cholecystectomy 1970    s/p  . Appendectomy 1970  . Abdominal hysterectomy 1985  . S/p bilat bunionectomy 2005  . S/p right mastoidectomy     at 46 mo old    reports that she has quit smoking. She does not have any smokeless tobacco history on file. She reports that she does not drink alcohol. Her drug history not on file. family history includes Cancer in her brother and sister; Diabetes in her sister; and Hyperlipidemia in her brother. No Known Allergies Current Outpatient Prescriptions on File Prior to Visit  Medication Sig Dispense Refill  . aspirin 81 MG EC tablet Take 81 mg by mouth daily.        . calcium citrate-vitamin D (CITRACAL+D) 315-200 MG-UNIT per tablet Take 1 tablet by mouth 2 (two) times daily.        . cetirizine (ZYRTEC) 5 MG tablet Take 1 tablet (5 mg total) by mouth daily.  90 tablet  3  . Cranberry-Vitamin C-Vitamin E (CRANBERRY PLUS VITAMIN C) 140-100-3 MG-MG-UNIT CAPS Take by mouth daily.        . Garlic (GARLIQUE) 400 MG TBEC Take by mouth daily.        . Multiple Vitamin (MULTIVITAMIN) capsule Take 1 capsule by mouth daily.        . Nutritional Supplements (ESTROVEN) TABS Take by mouth daily.        . Omega-3 Fatty Acids (FISH OIL) 1000 MG CAPS Take by mouth daily.        . Psyllium-Calcium (METAMUCIL PLUS CALCIUM) CAPS Take  by mouth. Use as directed       . [DISCONTINUED] donepezil (ARICEPT) 10 MG tablet Take 1 tablet (10 mg total) by mouth daily.  90 tablet  3  . [DISCONTINUED] irbesartan (AVAPRO) 150 MG tablet Take 1 tablet (150 mg total) by mouth at bedtime.  90 tablet  3  . [DISCONTINUED] rosuvastatin (CRESTOR) 40 MG tablet Take 1 tablet (40 mg total) by mouth daily.  90 tablet  3  . citalopram (CELEXA) 10 MG tablet Take 1 tablet (10 mg total) by mouth daily.  90 tablet  3  . risperiDONE (RISPERDAL) 0.25 MG tablet Take 1 tablet (0.25 mg total) by mouth 2 (two) times  daily.  60 tablet  11  . zolpidem (AMBIEN) 5 MG tablet Take 1 tablet (5 mg total) by mouth at bedtime as needed for sleep.  30 tablet  5   Review of Systems  Constitutional: Negative for diaphoresis and unexpected weight change.  HENT: Negative for tinnitus.   Eyes: Negative for photophobia and visual disturbance.  Respiratory: Negative for choking and stridor.   Gastrointestinal: Negative for vomiting and blood in stool.  Genitourinary: Negative for hematuria and decreased urine volume.  Musculoskeletal: Negative for gait problem.  Skin: Negative for color change and wound.  Neurological: Negative for tremors and numbness.  Psychiatric/Behavioral: Negative for decreased concentration. The patient is not hyperactive.       Objective:   Physical Exam BP 132/78  Pulse 66  Temp 97 F (36.1 C) (Oral)  Ht 5' 6.5" (1.689 m)  Wt 156 lb 8 oz (70.988 kg)  BMI 24.88 kg/m2  SpO2 95% Physical Exam  VS noted Constitutional: Pt appears well-developed and well-nourished.  HENT: Head: Normocephalic.  Right Ear: External ear normal.  Left Ear: External ear normal.  Eyes: Conjunctivae and EOM are normal. Pupils are equal, round, and reactive to light.  Neck: Normal range of motion. Neck supple.  Cardiovascular: Normal rate and regular rhythm.   Pulmonary/Chest: Effort normal and breath sounds normal.  Abd:  Soft, NT, non-distended, + BS Neurological: Pt is alert. Has baseline confusion Skin: Skin is warm. No erythema. No edema No abrasion/contusion to submental Psychiatric: Pt behavior is normal. Thought content c/w mod to severe dementia, not done formally     Assessment & Plan:

## 2012-06-13 NOTE — Assessment & Plan Note (Signed)
To add citalopram 10 qd,  to f/u any worsening symptoms or concerns  

## 2012-06-22 ENCOUNTER — Telehealth: Payer: Self-pay | Admitting: Internal Medicine

## 2012-06-22 NOTE — Telephone Encounter (Signed)
Patient Information:  Caller Name: [name]  Phone: [phone]  Patient: Rita Jones, Rita Jones  Gender: Female  DOB: 01-11-1931  Age: 78 Years  PCP: Oliver Barre (Adults only)   Symptoms  Reason For Call & Symptoms: They were in the office one week ago Wednesday . She has short term memory loss and she is on Aricept.  He wanted to try her on low dose of Resperidone. Daughter thinks she is taking the medication sporadic. she cannot get her to take them on schedule.  Daughter states on  Wednesday 06/20/12 she acted normal.  Yesterday, confused 06/21/12, very angry, upset wanting her to move out. She thinks the house they are not in her home.  Daughter is concerned is it the medication or part of the disease process.  Daughter is worried and upset and does not know what to do ?  Should she continue the medication?  what should she do?  Reviewed Health History In EMR: Yes  Reviewed Medications In EMR: Yes  Reviewed Allergies In EMR: Yes  Reviewed Surgeries / Procedures: Yes  Date of Onset of Symptoms: 06/14/2012  Guideline(s) Used:  Confusion - Delirium  Disposition Per Guideline:   See Today in Office  Reason For Disposition Reached:   Transient confusion (i.e., completely resolved)  Advice Given:  N/A  Office Follow Up:  Does the office need to follow up with this patient?: Yes  Instructions For The Office: Please contact daughter. She is worried about mother's behavior. She states she feels safe.  Patient Refused Recommendation:  Patient Refused Care Advice  would like the physician  to please call her.  RN Note:  Daughter would like physician to call her regarding the medication. Patient triages to be seen in the office today/ Offered visit for Saturday clinic. Unsure if her mother needs to be seen and just wants physician to call her Work [phone] . Home after 8p [phone]

## 2012-06-25 NOTE — Telephone Encounter (Signed)
Sure, that would be great if this works, and may lead to less "spacey"

## 2012-06-25 NOTE — Telephone Encounter (Signed)
Sounds like more that she is more agitated when she does not take the medication  The only way to know is to supervise her med use over a period of day (like 2-3 days) and monitor her behavior;  The risperdone is supposed to calm her, but if it truly is making her more agitated, then it should be stopped  The only way to know is to monitor her actual med use and behavior at the same time - can duaghter do this?

## 2012-06-25 NOTE — Telephone Encounter (Signed)
The patients daughter was able to monitor the patients meds. Over the weekend.  She was taking all as directed, her agitation was much better, but the daughter stated she was somewhat "spacey" and much calmer.  The daughter did give her 1/2 last night and 1/2 this am (risperdone), please advise if ok to do.

## 2012-06-26 NOTE — Telephone Encounter (Signed)
Called the patients daughter left message to call back. 

## 2012-06-26 NOTE — Telephone Encounter (Signed)
Patients daughter informed

## 2012-06-27 ENCOUNTER — Telehealth: Payer: Self-pay

## 2012-06-27 NOTE — Telephone Encounter (Signed)
The patients daughter called to inform she had put the patients pills in a pill box.  She had her morning pills separate from her evening pills.  She realized last night the patient had taken all her morning pills yesterday (tuesday through sat.) which was 1/2 risperidone, aricept and citalopram. The daughter did call [facility] last night as well as poision control and was informed the patient should be ok.  She stated the patient this morning is quite spacey and does not know where she is.  The daughter has now taken her pill box from her and keeps in her room.  Please advise

## 2012-06-27 NOTE — Telephone Encounter (Signed)
Informed the patients daughter of MD instructions.

## 2012-06-27 NOTE — Telephone Encounter (Signed)
The only medication to be overly concerned about due to this would be the risperdal, which can cause somnolence for an extended time if taken in high dose;  Please continue to observe the pt for this, ok to cont meds o/w as is (except should hold risperdal tonight);  Pt needs to have medication admin supervised completely from now on

## 2012-06-28 ENCOUNTER — Telehealth: Payer: Self-pay

## 2012-06-28 DIAGNOSIS — R413 Other amnesia: Secondary | ICD-10-CM

## 2012-06-28 DIAGNOSIS — R531 Weakness: Secondary | ICD-10-CM

## 2012-06-28 DIAGNOSIS — IMO0002 Reserved for concepts with insufficient information to code with codable children: Secondary | ICD-10-CM

## 2012-06-28 NOTE — Telephone Encounter (Signed)
The patients daughter would like a referral to a Geriatric MD.

## 2012-06-28 NOTE — Telephone Encounter (Signed)
Done per emr 

## 2012-06-29 NOTE — Telephone Encounter (Signed)
The patient's daughter is informed.

## 2012-07-23 ENCOUNTER — Telehealth: Payer: Self-pay

## 2012-07-23 DIAGNOSIS — R413 Other amnesia: Secondary | ICD-10-CM

## 2012-07-23 NOTE — Telephone Encounter (Signed)
The patients daughter is requesting to refill risperidone they have changed pharmacies from [facility] to [facility].  Please send new rx of Risperidone to [facility].  The patient has enough medication to last until Jul 30, 2012.

## 2012-07-23 NOTE — Telephone Encounter (Signed)
The daughter would also like a referral to [facility].  The patients daughter was informed at a memory care facility that a neurologist could be able to help further diagnosis what kind of dementia the patient may have and be able to medicate better.

## 2012-07-25 MED ORDER — RISPERIDONE 0.25 MG PO TABS: 0.2500 mg | ORAL_TABLET | Freq: Two times a day (BID) | ORAL | Status: DC

## 2012-07-25 NOTE — Telephone Encounter (Signed)
Referral done  rx for risperdal done to [facility]

## 2012-08-12 ENCOUNTER — Encounter: Payer: Self-pay | Admitting: Internal Medicine

## 2012-08-25 ENCOUNTER — Encounter: Payer: Self-pay | Admitting: Internal Medicine

## 2012-08-25 ENCOUNTER — Telehealth: Payer: Self-pay | Admitting: Internal Medicine

## 2012-08-25 NOTE — Telephone Encounter (Signed)
Zella Ball to address pt email  Hi, [name] again. I just tried to send this message as a Request Rx refill, but it didn't go through. Mom will need refills of the following: Irbesartan, Donepezil, and Citalopram. She has enough for the coming week, but will need them soon after that. She recently changed insurance to [employer] and is required to use the [employer]. Sorry we had them changed to [facility]! Please call them in to the [employer]. Thanks!!

## 2012-08-27 MED ORDER — IRBESARTAN 150 MG PO TABS: 150.0000 mg | ORAL_TABLET | Freq: Every day | ORAL | Status: DC

## 2012-08-27 MED ORDER — DONEPEZIL HCL 10 MG PO TABS: 10.0000 mg | ORAL_TABLET | Freq: Every day | ORAL | Status: DC

## 2012-08-27 MED ORDER — CITALOPRAM HYDROBROMIDE 10 MG PO TABS: 10.0000 mg | ORAL_TABLET | Freq: Every day | ORAL | Status: DC

## 2012-09-08 ENCOUNTER — Other Ambulatory Visit: Payer: Self-pay

## 2012-12-13 ENCOUNTER — Ambulatory Visit: Payer: Medicare HMO | Admitting: Internal Medicine

## 2012-12-13 DIAGNOSIS — Z0289 Encounter for other administrative examinations: Secondary | ICD-10-CM

## 2012-12-17 ENCOUNTER — Encounter: Payer: Self-pay | Admitting: Internal Medicine

## 2012-12-18 NOTE — Telephone Encounter (Signed)
Robin to do letter please

## 2012-12-18 NOTE — Telephone Encounter (Signed)
This is address info

## 2012-12-26 ENCOUNTER — Encounter: Payer: Self-pay | Admitting: Internal Medicine

## 2012-12-26 ENCOUNTER — Ambulatory Visit (INDEPENDENT_AMBULATORY_CARE_PROVIDER_SITE_OTHER): Payer: BC Managed Care – PPO | Admitting: Internal Medicine

## 2012-12-26 ENCOUNTER — Other Ambulatory Visit: Payer: Self-pay | Admitting: Internal Medicine

## 2012-12-26 ENCOUNTER — Other Ambulatory Visit (INDEPENDENT_AMBULATORY_CARE_PROVIDER_SITE_OTHER): Payer: BC Managed Care – PPO

## 2012-12-26 VITALS — BP 132/88 | HR 79 | Temp 97.3°F | Wt 161.5 lb

## 2012-12-26 DIAGNOSIS — R32 Unspecified urinary incontinence: Secondary | ICD-10-CM

## 2012-12-26 DIAGNOSIS — F068 Other specified mental disorders due to known physiological condition: Secondary | ICD-10-CM

## 2012-12-26 DIAGNOSIS — E785 Hyperlipidemia, unspecified: Secondary | ICD-10-CM

## 2012-12-26 DIAGNOSIS — I10 Essential (primary) hypertension: Secondary | ICD-10-CM

## 2012-12-26 LAB — URINALYSIS, ROUTINE W REFLEX MICROSCOPIC
Bilirubin Urine: NEGATIVE
Nitrite: NEGATIVE
Total Protein, Urine: 100

## 2012-12-26 LAB — CBC WITH DIFFERENTIAL/PLATELET
Eosinophils Relative: 1.7 % (ref 0.0–5.0)
HCT: 42.9 % (ref 36.0–46.0)
Lymphs Abs: 2.8 10*3/uL (ref 0.7–4.0)
MCHC: 33.7 g/dL (ref 30.0–36.0)
MCV: 91.7 fl (ref 78.0–100.0)
Monocytes Absolute: 1 10*3/uL (ref 0.1–1.0)
Platelets: 319 10*3/uL (ref 150.0–400.0)
RDW: 13 % (ref 11.5–14.6)
WBC: 9.6 10*3/uL (ref 4.5–10.5)

## 2012-12-26 LAB — LIPID PANEL
HDL: 54.7 mg/dL (ref >39.00)
Total CHOL/HDL Ratio: 5
VLDL: 19.4 mg/dL (ref 0.0–40.0)

## 2012-12-26 LAB — BASIC METABOLIC PANEL
GFR: 61.73 mL/min (ref >60.00)
Potassium: 3.8 mEq/L (ref 3.5–5.1)
Sodium: 139 mEq/L (ref 135–145)

## 2012-12-26 LAB — HEPATIC FUNCTION PANEL
AST: 23 U/L (ref 0–37)
Alkaline Phosphatase: 99 U/L (ref 39–117)
Bilirubin, Direct: 0.1 mg/dL (ref 0.0–0.3)
Total Bilirubin: 0.4 mg/dL (ref 0.3–1.2)

## 2012-12-26 LAB — TSH: TSH: 2.43 u[IU]/mL (ref 0.35–5.50)

## 2012-12-26 MED ORDER — NITROFURANTOIN MONOHYD MACRO 100 MG PO CAPS: 100.0000 mg | ORAL_CAPSULE | Freq: Two times a day (BID) | ORAL | Status: DC

## 2012-12-26 NOTE — Progress Notes (Signed)
Subjective:    Patient ID: Rita Jones, female    DOB: 1931-03-23, 79 y.o.   MRN: [id number]  HPI  Here to f/u; overall doing ok,  Pt denies chest pain, increased sob or doe, wheezing, orthopnea, PND, increased LE swelling, palpitations, dizziness or syncope.  Pt denies polydipsia, polyuria, or low sugar symptoms such as weakness or confusion improved with po intake.  Pt denies new neurological symptoms such as new headache, or facial or extremity weakness or numbness.   Pt states overall good compliance with meds, has been trying to follow lower cholesterol diet, with wt overall stable,  but little exercise however.  Does have several wks ongoing nasal allergy symptoms with clearish congestion, itch and sneezing, without fever, pain, ST, cough, swelling or wheezing, does not feel it is bad enough for even OTC tx. I have letter from daughter who mentions memory is worsening, and today pt knows year only, thinks her sister came with her today (brought by her daughter), cannot remember her sister died 15 yrs ago. Pt states Denies urinary symptoms such as dysuria, frequency, urgency, flank pain, hematuria or n/v, fever, chills, or incontinence, though she is more incont per daughter's note. Denies pain at all, even lower back or legs, though she told daughter she did and will not exercise due to this.   Pt denies fever, wt loss, night sweats, loss of appetite, or other constitutional symptoms, infact gained 5 lbs since nov 2013. Past Medical History  Diagnosis Date  . HYPERLIPIDEMIA 10/26/2007  . DEMENTIA 08/27/2009  . HYPERTENSION 09/01/2008  . UTI 05/18/2009  . MENOPAUSAL DISORDER 02/25/2010  . OSTEOPENIA 02/25/2008  . INSOMNIA-SLEEP DISORDER-UNSPEC 10/21/2009  . FATIGUE 10/21/2009  . Memory loss 02/25/2008  . FACIAL FLUSHING 10/21/2009  . Dysuria 04/12/2010  . COLONIC POLYPS, HX OF 02/25/2008  . DIVERTICULOSIS, COLON 09/02/2010   Past Surgical History  Procedure Laterality Date  . Cholecystectomy  1970     s/p  . Appendectomy  1970  . Abdominal hysterectomy  1985  . S/p bilat bunionectomy  2005  . S/p right mastoidectomy      at 17 mo old    reports that she has quit smoking. She does not have any smokeless tobacco history on file. She reports that she does not drink alcohol. Her drug history is not on file. family history includes Cancer in her brother and sister; Diabetes in her sister; and Hyperlipidemia in her brother. No Known Allergies Current Outpatient Prescriptions on File Prior to Visit  Medication Sig Dispense Refill  . aspirin 81 MG EC tablet Take 81 mg by mouth daily.        . calcium citrate-vitamin D (CITRACAL+D) 315-200 MG-UNIT per tablet Take 1 tablet by mouth 2 (two) times daily.        . cetirizine (ZYRTEC) 5 MG tablet Take 1 tablet (5 mg total) by mouth daily.  90 tablet  3  . citalopram (CELEXA) 10 MG tablet Take 1 tablet (10 mg total) by mouth daily.  90 tablet  3  . Cranberry-Vitamin C-Vitamin E (CRANBERRY PLUS VITAMIN C) 140-100-3 MG-MG-UNIT CAPS Take by mouth daily.        Marland Kitchen donepezil (ARICEPT) 10 MG tablet Take 1 tablet (10 mg total) by mouth daily.  90 tablet  3  . Garlic (GARLIQUE) 400 MG TBEC Take by mouth daily.        . irbesartan (AVAPRO) 150 MG tablet Take 1 tablet (150 mg total) by mouth at bedtime.  90 tablet  3  . Multiple Vitamin (MULTIVITAMIN) capsule Take 1 capsule by mouth daily.        . Nutritional Supplements (ESTROVEN) TABS Take by mouth daily.        . Omega-3 Fatty Acids (FISH OIL) 1000 MG CAPS Take by mouth daily.        . Psyllium-Calcium (METAMUCIL PLUS CALCIUM) CAPS Take by mouth. Use as directed       . risperiDONE (RISPERDAL) 0.25 MG tablet Take 1 tablet (0.25 mg total) by mouth 2 (two) times daily.  60 tablet  11  . rosuvastatin (CRESTOR) 40 MG tablet Take 1 tablet (40 mg total) by mouth daily.  90 tablet  3  . zolpidem (AMBIEN) 5 MG tablet Take 1 tablet (5 mg total) by mouth at bedtime as needed for sleep.  30 tablet  5   No current  facility-administered medications on file prior to visit.    Review of Systems  Constitutional: Negative for unexpected weight change, or unusual diaphoresis  HENT: Negative for tinnitus.   Eyes: Negative for photophobia and visual disturbance.  Respiratory: Negative for choking and stridor.   Gastrointestinal: Negative for vomiting and blood in stool.  Genitourinary: Negative for hematuria and decreased urine volume.  Musculoskeletal: Negative for acute joint swelling Skin: Negative for color change and wound.  Neurological: Negative for tremors and numbness other than noted  Psychiatric/Behavioral: Negative for decreased concentration or  hyperactivity.      Objective:   Physical Exam BP 132/88  Pulse 79  Temp(Src) 97.3 F (36.3 C) (Oral)  Wt 161 lb 8 oz (73.256 kg)  BMI 25.68 kg/m2  SpO2 95% VS noted,  Constitutional: Pt appears well-developed and well-nourished.  HENT: Head: NCAT.  Right Ear: External ear normal.  Left Ear: External ear normal.  Eyes: Conjunctivae and EOM are normal. Pupils are equal, round, and reactive to light.  Neck: Normal range of motion. Neck supple.  Cardiovascular: Normal rate and regular rhythm.   Pulmonary/Chest: Effort normal and breath sounds normal.  Abd:  Soft, NT, non-distended, + BS Neurological: Pt is alert. Confused as above. Skin: Skin is warm. No erythema.  Psychiatric: Pt behavior is normal. Thought content c/w mod to severe dementia    Assessment & Plan:

## 2012-12-26 NOTE — Assessment & Plan Note (Signed)
Pt denies problem today but agrees to check UA

## 2012-12-26 NOTE — Assessment & Plan Note (Signed)
stable overall by history and exam, recent data reviewed with pt, and pt to continue medical treatment as before,  to f/u any worsening symptoms or concerns Lab Results  Component Value Date   LDLCALC 96 02/18/2010

## 2012-12-26 NOTE — Assessment & Plan Note (Signed)
stable overall by history and exam, recent data reviewed with pt, and pt to continue medical treatment as before,  to f/u any worsening symptoms or concerns BP Readings from Last 3 Encounters:  12/26/12 132/88  06/13/12 132/78  11/03/11 132/80

## 2012-12-26 NOTE — Patient Instructions (Signed)
Please continue all other medications as before Please have the pharmacy call with any other refills you may need.  Please go to the LAB in the Basement (turn left off the elevator) for the tests to be done today, including the urine testing  You will be contacted by phone if any changes need to be made immediately.  Otherwise, you will receive a letter about your results with an explanation, but please check with MyChart first.  Thank you for enrolling in MyChart. Please follow the instructions below to securely access your online medical record. MyChart allows you to send messages to your doctor, view your test results, renew your prescriptions, schedule appointments, and more.  Please be aware that your reflexes and condition is such that I will need to recommend to the [facility] [employer], that your license be revoked, as I feel there is too much risk to yourself and others.  Please return in 6 months, or sooner if needed
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
## 2013-01-02 ENCOUNTER — Telehealth: Payer: Self-pay | Admitting: Internal Medicine

## 2013-01-02 MED ORDER — MIRTAZAPINE 15 MG PO TABS: 15.0000 mg | ORAL_TABLET | Freq: Every day | ORAL | Status: DC

## 2013-01-02 NOTE — Telephone Encounter (Signed)
Zella Ball to contact daughter

## 2013-01-02 NOTE — Telephone Encounter (Signed)
Received info per daughter stating pt's persistent problem with night time awakening, wandering  Ok to stop the celexa, and start the Remeron 15 qhs  - which is supposed to help with sleep, nerves, low mood, and can help with appetite as well

## 2013-01-03 NOTE — Telephone Encounter (Signed)
Called the daughter at work and home number left message to call back

## 2013-01-03 NOTE — Telephone Encounter (Signed)
Called informed the daughter of medication change.

## 2013-01-07 ENCOUNTER — Telehealth: Payer: Self-pay

## 2013-01-07 MED ORDER — CLONAZEPAM 2 MG PO TABS: ORAL_TABLET | ORAL | Status: DC

## 2013-01-07 NOTE — Telephone Encounter (Signed)
Ok to try klonopin asd  Done hardcopy to robin '

## 2013-01-07 NOTE — Telephone Encounter (Signed)
Called the daughter no answer
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
## 2013-01-07 NOTE — Telephone Encounter (Signed)
Called the daughter and faxed hardcopy to [employer] [place]

## 2013-01-10 ENCOUNTER — Emergency Department (HOSPITAL_COMMUNITY)
Admission: EM | Admit: 2013-01-10 | Discharge: 2013-01-11 | Disposition: A | Discharge status: Home / Self Care | Payer: Medicare Other | Attending: Emergency Medicine | Admitting: Emergency Medicine

## 2013-01-10 ENCOUNTER — Emergency Department (HOSPITAL_COMMUNITY): Payer: Medicare Other

## 2013-01-10 ENCOUNTER — Encounter (HOSPITAL_COMMUNITY): Payer: Self-pay | Admitting: *Deleted

## 2013-01-10 DIAGNOSIS — Z8742 Personal history of other diseases of the female genital tract: Secondary | ICD-10-CM | POA: Insufficient documentation

## 2013-01-10 DIAGNOSIS — E785 Hyperlipidemia, unspecified: Secondary | ICD-10-CM | POA: Insufficient documentation

## 2013-01-10 DIAGNOSIS — Z7982 Long term (current) use of aspirin: Secondary | ICD-10-CM | POA: Insufficient documentation

## 2013-01-10 DIAGNOSIS — G4700 Insomnia, unspecified: Secondary | ICD-10-CM | POA: Insufficient documentation

## 2013-01-10 DIAGNOSIS — Y929 Unspecified place or not applicable: Secondary | ICD-10-CM | POA: Insufficient documentation

## 2013-01-10 DIAGNOSIS — I10 Essential (primary) hypertension: Secondary | ICD-10-CM | POA: Insufficient documentation

## 2013-01-10 DIAGNOSIS — R040 Epistaxis: Secondary | ICD-10-CM | POA: Insufficient documentation

## 2013-01-10 DIAGNOSIS — S022XXA Fracture of nasal bones, initial encounter for closed fracture: Secondary | ICD-10-CM | POA: Insufficient documentation

## 2013-01-10 DIAGNOSIS — Z23 Encounter for immunization: Secondary | ICD-10-CM | POA: Insufficient documentation

## 2013-01-10 DIAGNOSIS — Z860100 Personal history of colon polyps, unspecified: Secondary | ICD-10-CM | POA: Insufficient documentation

## 2013-01-10 DIAGNOSIS — Y9389 Activity, other specified: Secondary | ICD-10-CM | POA: Insufficient documentation

## 2013-01-10 DIAGNOSIS — Z79899 Other long term (current) drug therapy: Secondary | ICD-10-CM | POA: Insufficient documentation

## 2013-01-10 DIAGNOSIS — Z8719 Personal history of other diseases of the digestive system: Secondary | ICD-10-CM | POA: Insufficient documentation

## 2013-01-10 DIAGNOSIS — Z8739 Personal history of other diseases of the musculoskeletal system and connective tissue: Secondary | ICD-10-CM | POA: Insufficient documentation

## 2013-01-10 DIAGNOSIS — J3489 Other specified disorders of nose and nasal sinuses: Secondary | ICD-10-CM | POA: Insufficient documentation

## 2013-01-10 DIAGNOSIS — Z87891 Personal history of nicotine dependence: Secondary | ICD-10-CM | POA: Insufficient documentation

## 2013-01-10 DIAGNOSIS — Z87448 Personal history of other diseases of urinary system: Secondary | ICD-10-CM | POA: Insufficient documentation

## 2013-01-10 DIAGNOSIS — F0390 Unspecified dementia without behavioral disturbance: Secondary | ICD-10-CM | POA: Insufficient documentation

## 2013-01-10 DIAGNOSIS — IMO0002 Reserved for concepts with insufficient information to code with codable children: Secondary | ICD-10-CM | POA: Insufficient documentation

## 2013-01-10 NOTE — ED Notes (Signed)
Pt reports falling tonight.  Pt very confused-per her norm.  Pts daughter found pt in the bathroom floor, awake and oriented.  States that she did not see her mother fall.  Pt has a hx of dementia and has had her nighttime medications that make her sleepy.  Pt noted to have a bloody and swollen nose.  PERRLA.  Pt alert to person only.

## 2013-01-11 ENCOUNTER — Telehealth: Payer: Self-pay

## 2013-01-11 DIAGNOSIS — F0391 Unspecified dementia with behavioral disturbance: Secondary | ICD-10-CM

## 2013-01-11 DIAGNOSIS — G4700 Insomnia, unspecified: Secondary | ICD-10-CM

## 2013-01-11 DIAGNOSIS — Z9181 History of falling: Secondary | ICD-10-CM

## 2013-01-11 MED ORDER — TETANUS-DIPHTH-ACELL PERTUSSIS 5-2.5-18.5 LF-MCG/0.5 IM SUSP
0.5000 mL | Freq: Once | INTRAMUSCULAR | Status: AC
  Administered 2013-01-11: 0.5 mL via INTRAMUSCULAR
  Filled 2013-01-11: qty 0.5

## 2013-01-11 NOTE — Telephone Encounter (Signed)
There is really no way to know one way or the other.  Ok to stop the klonopin for now.  I can refer her to Geriatrician at [facility] (or I think [facility]) for better help, as I am not sure of how to treat further, and avoid further possible complications

## 2013-01-11 NOTE — Telephone Encounter (Signed)
Patients daughter informed and agreed to a referral, but please as close to [facility] as possible

## 2013-01-11 NOTE — Telephone Encounter (Signed)
Referral done

## 2013-01-11 NOTE — ED Notes (Signed)
Pt. Refused wheelchair out. 

## 2013-01-11 NOTE — Telephone Encounter (Signed)
The patient fell in the bathroom last night and broke her nose.  The daughter did take the patient to the ER but patient did not want to stay to have a CT.  The daughter was concerned that the new medication prescribed by PCP may have made her dizzy and was why she fell.  Also stated she would call to schedule a ER Followup at her convenience.  Please advise

## 2013-01-11 NOTE — ED Provider Notes (Signed)
History     CSN: [id number]  Arrival date & time 01/10/13  2230   First MD Initiated Contact with Patient 01/11/13 [phone]      Chief Complaint  Patient presents with  . Fall    Patient is a 79 y.o. female presenting with facial injury. The history is provided by the patient.  Facial Injury Mechanism of injury:  Direct blow Location:  Face and nose Pain details:    Quality:  Aching   Severity:  Mild   Timing:  Constant   Progression:  Worsening Chronicity:  New Worsened by:  Nothing tried Associated symptoms: congestion and epistaxis   Associated symptoms: no altered mental status, no double vision, no headaches and no loss of consciousness   pt presents with daughter for nose injury Daughter lives with patient She reports she heard the patient in the bathroom and she found her in the floor and was holding her nose Pt reports she "ran into something" and hurt her nose She denies new weakness.  Denies headache She denies double or blurred vision   Pt has h/o dementia and is at her baseline per daughter She had otherwise been well recently  Past Medical History  Diagnosis Date  . HYPERLIPIDEMIA 10/26/2007  . DEMENTIA 08/27/2009  . HYPERTENSION 09/01/2008  . UTI 05/18/2009  . MENOPAUSAL DISORDER 02/25/2010  . OSTEOPENIA 02/25/2008  . INSOMNIA-SLEEP DISORDER-UNSPEC 10/21/2009  . FATIGUE 10/21/2009  . Memory loss 02/25/2008  . FACIAL FLUSHING 10/21/2009  . Dysuria 04/12/2010  . COLONIC POLYPS, HX OF 02/25/2008  . DIVERTICULOSIS, COLON 09/02/2010    Past Surgical History  Procedure Laterality Date  . Cholecystectomy  1970    s/p  . Appendectomy  1970  . Abdominal hysterectomy  1985  . S/p bilat bunionectomy  2005  . S/p right mastoidectomy      at 32 mo old    Family History  Problem Relation Age of Onset  . Cancer Sister     breast cancer  . Diabetes Sister   . Cancer Brother     colon cancer  . Hyperlipidemia Brother     History  Substance Use Topics  . Smoking  status: Former Games developer  . Smokeless tobacco: Not on file     Comment: quit tobacco in January of 2000. She was a light smoker for 30 years.  . Alcohol Use: No    OB History   Grav Para Term Preterm Abortions TAB SAB Ect Mult Living                  Review of Systems  Constitutional: Negative for fever.  HENT: Positive for nosebleeds and congestion.   Eyes: Negative for double vision, redness and visual disturbance.  Cardiovascular: Negative for chest pain.  Gastrointestinal: Negative for abdominal pain.  Neurological: Negative for loss of consciousness, weakness and headaches.  Psychiatric/Behavioral: Negative for altered mental status.  All other systems reviewed and are negative.    Allergies  Review of patient's allergies indicates no known allergies.  Home Medications   Current Outpatient Rx  Name  Route  Sig  Dispense  Refill  . aspirin 81 MG EC tablet   Oral   Take 81 mg by mouth daily.           . calcium citrate-vitamin D (CITRACAL+D) 315-200 MG-UNIT per tablet   Oral   Take 1 tablet by mouth 2 (two) times daily.           . cetirizine (ZYRTEC)  5 MG tablet   Oral   Take 1 tablet (5 mg total) by mouth daily.   90 tablet   3   . clonazePAM (KLONOPIN) 2 MG tablet      1/2 - 1 tab by mouth at bedtime   30 tablet   3   . Cranberry-Vitamin C-Vitamin E (CRANBERRY PLUS VITAMIN C) 140-100-3 MG-MG-UNIT CAPS   Oral   Take by mouth daily.           Marland Kitchen donepezil (ARICEPT) 10 MG tablet   Oral   Take 1 tablet (10 mg total) by mouth daily.   90 tablet   3   . Garlic (GARLIQUE) 400 MG TBEC   Oral   Take by mouth daily.           . irbesartan (AVAPRO) 150 MG tablet   Oral   Take 1 tablet (150 mg total) by mouth at bedtime.   90 tablet   3   . mirtazapine (REMERON) 15 MG tablet   Oral   Take 1 tablet (15 mg total) by mouth at bedtime.   90 tablet   3   . Multiple Vitamin (MULTIVITAMIN) capsule   Oral   Take 1 capsule by mouth daily.            . nitrofurantoin, macrocrystal-monohydrate, (MACROBID) 100 MG capsule   Oral   Take 1 capsule (100 mg total) by mouth 2 (two) times daily.   20 capsule   0   . Nutritional Supplements (ESTROVEN) TABS   Oral   Take by mouth daily.           . Omega-3 Fatty Acids (FISH OIL) 1000 MG CAPS   Oral   Take by mouth daily.           . Psyllium-Calcium (METAMUCIL PLUS CALCIUM) CAPS   Oral   Take by mouth. Use as directed          . risperiDONE (RISPERDAL) 0.25 MG tablet   Oral   Take 1 tablet (0.25 mg total) by mouth 2 (two) times daily.   60 tablet   11   . rosuvastatin (CRESTOR) 40 MG tablet   Oral   Take 1 tablet (40 mg total) by mouth daily.   90 tablet   3   . EXPIRED: zolpidem (AMBIEN) 5 MG tablet   Oral   Take 1 tablet (5 mg total) by mouth at bedtime as needed for sleep.   30 tablet   5     BP 140/86  Pulse 77  Temp(Src) 96.8 F (36 C) (Oral)  Resp 18  SpO2 95%  Physical Exam CONSTITUTIONAL: Well developed/well nourished, no distress, requesting discharge as soon as I arrive HEAD: Normocephalic/atraumatic EYES: EOMI/PERRL, no orbital rim tenderness/stepoff. ENMT: Mucous membranes moist. Bruising and swelling noted to nose.  There is blood to bridge of nose but no lacerations.  No septal hematoma.  Dried blood noted in nose She has no tenderness maxilla.  No malocclusion. No trismus.  No dental injury/fracture noted.   NECK: supple no meningeal signs SPINE:entire spine nontender CV: S1/S2 noted, no murmurs/rubs/gallops noted LUNGS: Lungs are clear to auscultation bilaterally, no apparent distress ABDOMEN: soft, nontender, no rebound or guarding GU:no cva tenderness NEURO: Pt is awake/alert, moves all extremitiesx4.  She ambulates around the room in no distress.   EXTREMITIES: pulses normal, full ROM. All extremities/joints palpated/ranged and nontender SKIN: warm, color normal PSYCH: no abnormalities of mood noted  ED Course  Procedures   Labs  Reviewed - No data to display Dg Nasal Bones  01/11/2013   *RADIOLOGY REPORT*  Clinical Data: History of fall with laceration to bridge of the nose.  NASAL BONES - 3+ VIEW  Comparison: No priors.  Findings: Three views of the nasal bones demonstrate a minimally depressed nasal bone fracture.  Paranasal sinuses appear well pneumatized without definite air fluid levels to suggest hemosinus.  IMPRESSION: 1.  Minimally depressed nasal bone fracture.   Original Report Authenticated By: Trudie Reed, M.D.     1. Nasal fracture, closed, initial encounter       MDM  Nursing notes including past medical history and social history reviewed and considered in documentation xrays reviewed and considered  Pt was ready to leave on my arrival to room.  Xray confirms nasal fracture She is not on anticoagulants Daughter who lives with patient confirms she is at her baseline.   I did recommend CT head/maxillofacial, but patient prefers to leave.  Daughter will observe her for any mental status changes/vomiting/headache.  Also advised if there any visual changes for her to return        Joya Gaskins, MD 01/11/13 [phone]

## 2013-01-11 NOTE — ED Notes (Signed)
MD at bedside. 

## 2013-01-14 ENCOUNTER — Telehealth: Payer: Self-pay | Admitting: Internal Medicine

## 2013-01-14 NOTE — Telephone Encounter (Signed)
Dr Jonny Ruiz patient was schedule  January 22, 2013 @ 2pm with [facility] [address]  [facility] [place] [address] [phone] Records faxed to [phone] Pt daughter declined referral .

## 2013-01-22 ENCOUNTER — Telehealth: Payer: Self-pay

## 2013-01-22 NOTE — Telephone Encounter (Signed)
Noted ok. 

## 2013-01-22 NOTE — Telephone Encounter (Signed)
The patient has appointment this Thursday January 24, 2013 with JWJ.  The daughter has found a senior care provider here in [place] on [address]. Dr. Bufford Spikes and has appointment in September.  But her insurance requires that physician to be her PCP.  The daughter appreciates the care she has received from Dr. Jonny Ruiz but feels it best to change due to her dementia..  The daughter would like Dr. Jonny Ruiz to discuss this with the patient on Thursday that it may be best for her care.  No phone call to the daughter is necessary she wanted you to be aware of.

## 2013-01-24 ENCOUNTER — Ambulatory Visit (INDEPENDENT_AMBULATORY_CARE_PROVIDER_SITE_OTHER): Payer: BC Managed Care – PPO | Admitting: Internal Medicine

## 2013-01-24 ENCOUNTER — Encounter: Payer: Self-pay | Admitting: Internal Medicine

## 2013-01-24 VITALS — BP 102/80 | HR 71 | Temp 98.0°F | Wt 159.5 lb

## 2013-01-24 DIAGNOSIS — I10 Essential (primary) hypertension: Secondary | ICD-10-CM

## 2013-01-24 DIAGNOSIS — F068 Other specified mental disorders due to known physiological condition: Secondary | ICD-10-CM

## 2013-01-24 DIAGNOSIS — E785 Hyperlipidemia, unspecified: Secondary | ICD-10-CM

## 2013-01-24 NOTE — Patient Instructions (Signed)
Please continue all other medications as before, and refills have been done if requested. Rita Jones with your new Doctor who specializes in older patients.  There is no need for further medication changes, or lab work today.

## 2013-01-24 NOTE — Assessment & Plan Note (Signed)
Elevated last visit, to cont crestor at 40 mg, urged compliance, with diet and med

## 2013-01-24 NOTE — Progress Notes (Signed)
Subjective:    Patient ID: Rita Jones, female    DOB: March 25, 1931, 79 y.o.   MRN: [id number]  HPI  Here to f/u per daughter for last visit before change PCP to a local geriatrician, Dr Bufford Spikes. Overall doing ok,  Pt denies chest pain, increased sob or doe, wheezing, orthopnea, PND, increased LE swelling, palpitations, dizziness or syncope.  Pt denies polydipsia, polyuria, Pt denies new neurological symptoms such as new headache, or facial or extremity weakness or numbness.   Pt states overall good compliance with meds, has been trying to follow lower cholesterol, diabetic diet, with wt overall stable, remains with signficant dementia,  Denies significant pain, though has some swelling to left knee today.  Overall good compliance with treatment, and good medicine tolerability, including the remeron and crestor Past Medical History  Diagnosis Date  . HYPERLIPIDEMIA 10/26/2007  . DEMENTIA 08/27/2009  . HYPERTENSION 09/01/2008  . UTI 05/18/2009  . MENOPAUSAL DISORDER 02/25/2010  . OSTEOPENIA 02/25/2008  . INSOMNIA-SLEEP DISORDER-UNSPEC 10/21/2009  . FATIGUE 10/21/2009  . Memory loss 02/25/2008  . FACIAL FLUSHING 10/21/2009  . Dysuria 04/12/2010  . COLONIC POLYPS, HX OF 02/25/2008  . DIVERTICULOSIS, COLON 09/02/2010   Past Surgical History  Procedure Laterality Date  . Cholecystectomy  1970    s/p  . Appendectomy  1970  . Abdominal hysterectomy  1985  . S/p bilat bunionectomy  2005  . S/p right mastoidectomy      at 63 mo old    reports that she has quit smoking. She does not have any smokeless tobacco history on file. She reports that she does not drink alcohol. Her drug history is not on file. family history includes Cancer in her brother and sister; Diabetes in her sister; and Hyperlipidemia in her brother. No Known Allergies Current Outpatient Prescriptions on File Prior to Visit  Medication Sig Dispense Refill  . aspirin 81 MG EC tablet Take 81 mg by mouth daily.        . calcium  citrate-vitamin D (CITRACAL+D) 315-200 MG-UNIT per tablet Take 1 tablet by mouth 2 (two) times daily.        . cetirizine (ZYRTEC) 5 MG tablet Take 1 tablet (5 mg total) by mouth daily.  90 tablet  3  . clonazePAM (KLONOPIN) 2 MG tablet 1/2 - 1 tab by mouth at bedtime  30 tablet  3  . Cranberry-Vitamin C-Vitamin E (CRANBERRY PLUS VITAMIN C) 140-100-3 MG-MG-UNIT CAPS Take by mouth daily.        Marland Kitchen donepezil (ARICEPT) 10 MG tablet Take 1 tablet (10 mg total) by mouth daily.  90 tablet  3  . Garlic (GARLIQUE) 400 MG TBEC Take by mouth daily.        . irbesartan (AVAPRO) 150 MG tablet Take 1 tablet (150 mg total) by mouth at bedtime.  90 tablet  3  . mirtazapine (REMERON) 15 MG tablet Take 1 tablet (15 mg total) by mouth at bedtime.  90 tablet  3  . Multiple Vitamin (MULTIVITAMIN) capsule Take 1 capsule by mouth daily.        . nitrofurantoin, macrocrystal-monohydrate, (MACROBID) 100 MG capsule Take 1 capsule (100 mg total) by mouth 2 (two) times daily.  20 capsule  0  . Nutritional Supplements (ESTROVEN) TABS Take by mouth daily.        . Omega-3 Fatty Acids (FISH OIL) 1000 MG CAPS Take by mouth daily.        . Psyllium-Calcium (METAMUCIL PLUS CALCIUM) CAPS Take by  mouth. Use as directed       . risperiDONE (RISPERDAL) 0.25 MG tablet Take 1 tablet (0.25 mg total) by mouth 2 (two) times daily.  60 tablet  11  . rosuvastatin (CRESTOR) 40 MG tablet Take 1 tablet (40 mg total) by mouth daily.  90 tablet  3  . zolpidem (AMBIEN) 5 MG tablet Take 1 tablet (5 mg total) by mouth at bedtime as needed for sleep.  30 tablet  5   No current facility-administered medications on file prior to visit.   Review of Systems  Constitutional: Negative for unexpected weight change, or unusual diaphoresis  HENT: Negative for tinnitus.   Eyes: Negative for photophobia and visual disturbance.  Respiratory: Negative for choking and stridor.   Gastrointestinal: Negative for vomiting and blood in stool.  Genitourinary:  Negative for hematuria and decreased urine volume.  Musculoskeletal: Negative for acute joint swelling Skin: Negative for color change and wound.  Neurological: Negative for tremors and numbness other than noted  Psychiatric/Behavioral: Negative for decreased concentration or  hyperactivity.       Objective:   Physical Exam BP 102/80  Pulse 71  Temp(Src) 98 F (36.7 C) (Oral)  Wt 159 lb 8 oz (72.349 kg)  BMI 25.36 kg/m2  SpO2 95% VS noted, not ill appearing Constitutional: Pt appears well-developed and well-nourished.  HENT: Head: NCAT.  Right Ear: External ear normal.  Left Ear: External ear normal.  Eyes: Conjunctivae and EOM are normal. Pupils are equal, round, and reactive to light.  Neck: Normal range of motion. Neck supple.  Cardiovascular: Normal rate and regular rhythm.   Pulmonary/Chest: Effort normal and breath sounds normal.  Abd:  Soft, NT, non-distended, + BS Neurological: Pt is alert.  Skin: Skin is warm. No erythema.  Psychiatric: Pt behavior is normal. Thought content c/w severe dementia    Assessment & Plan:

## 2013-01-24 NOTE — Assessment & Plan Note (Signed)
stable overall by history and exam, recent data reviewed with pt, and pt to continue medical treatment as before,  to f/u any worsening symptoms or concerns BP Readings from Last 3 Encounters:  01/24/13 102/80  01/11/13 140/86  12/26/12 132/88

## 2013-01-24 NOTE — Assessment & Plan Note (Signed)
stable overall by history and exam, recent data reviewed with pt, and pt to continue medical treatment as before,  to f/u any worsening symptoms or concerns Lab Results  Component Value Date   WBC 9.6 12/26/2012   HGB 14.4 12/26/2012   HCT 42.9 12/26/2012   PLT 319.0 12/26/2012   GLUCOSE 120* 12/26/2012   CHOL 280* 12/26/2012   TRIG 97.0 12/26/2012   HDL 54.70 12/26/2012   LDLDIRECT 211.2 12/26/2012   LDLCALC 96 02/18/2010   ALT 19 12/26/2012   AST 23 12/26/2012   NA 139 12/26/2012   K 3.8 12/26/2012   CL 106 12/26/2012   CREATININE 0.9 12/26/2012   BUN 11 12/26/2012   CO2 23 12/26/2012   TSH 2.43 12/26/2012   Pt changing PCP for ongoing care.

## 2013-02-26 ENCOUNTER — Encounter: Payer: Self-pay | Admitting: Internal Medicine

## 2013-03-04 DIAGNOSIS — Z0279 Encounter for issue of other medical certificate: Secondary | ICD-10-CM

## 2013-03-28 ENCOUNTER — Encounter: Payer: Self-pay | Admitting: *Deleted

## 2013-03-29 ENCOUNTER — Ambulatory Visit (INDEPENDENT_AMBULATORY_CARE_PROVIDER_SITE_OTHER): Payer: BC Managed Care – PPO | Admitting: Internal Medicine

## 2013-03-29 ENCOUNTER — Encounter: Payer: Self-pay | Admitting: Internal Medicine

## 2013-03-29 VITALS — BP 130/82 | HR 71 | Temp 97.4°F | Resp 18 | Ht 63.98 in | Wt 157.4 lb

## 2013-03-29 DIAGNOSIS — I10 Essential (primary) hypertension: Secondary | ICD-10-CM

## 2013-03-29 DIAGNOSIS — G4700 Insomnia, unspecified: Secondary | ICD-10-CM

## 2013-03-29 DIAGNOSIS — F3289 Other specified depressive episodes: Secondary | ICD-10-CM

## 2013-03-29 DIAGNOSIS — F329 Major depressive disorder, single episode, unspecified: Secondary | ICD-10-CM

## 2013-03-29 DIAGNOSIS — F32A Depression, unspecified: Secondary | ICD-10-CM

## 2013-03-29 DIAGNOSIS — R32 Unspecified urinary incontinence: Secondary | ICD-10-CM

## 2013-03-29 DIAGNOSIS — R413 Other amnesia: Secondary | ICD-10-CM | POA: Insufficient documentation

## 2013-03-29 DIAGNOSIS — E785 Hyperlipidemia, unspecified: Secondary | ICD-10-CM

## 2013-03-29 MED ORDER — CALCIUM CITRATE-VITAMIN D 315-200 MG-UNIT PO TABS: 1.0000 | ORAL_TABLET | Freq: Two times a day (BID) | ORAL | Status: DC

## 2013-03-29 MED ORDER — ROSUVASTATIN CALCIUM 40 MG PO TABS: 40.0000 mg | ORAL_TABLET | Freq: Every day | ORAL | Status: DC

## 2013-03-29 NOTE — Progress Notes (Signed)
Patient ID: Rita Jones, female   DOB: 1931/01/25, 79 y.o.   MRN: [id number] Location:  [facility] / [place] [facility]  Code Status: DNR  No Known Allergies  Chief Complaint  Patient presents with  . Establish Care    HPI: Patient is a 79 y.o. white female seen in the office today to establish with the practice.    No pain.  No complaints.  Blood pressure is good today--says it mostly is good everyday.  She is from [place], but has been here most of her life--daughter confirms this is since 2000.  She denies problems with her memory.  Sleeps well at night.  Mood is fine.  Denies feeling sad or blue.  Has very flat affect.  Denies urinary incontinence, but in medical record.  Denies dysuria, frequency, urgency.  Her daughter and some grandchildren live with her.  Unable to tell me how long they have lived there.    Upon speaking with her daughter, Rita Jones, she has been living with her mom since 2003 from [place]. Memory loss has been slowly progressing for a few years.  Had appt with Dr. Jonny Ruiz in beginning of November.  Thought she was able to function until that time.  Had been taking her meds regularly until then.  Doesn't like to take pills b/c she feels she doesn't need them.  At that appointment, risperidone was added.  She has been having hallucinations.  One tablet 0.25mg  bid.  Learned on thanksgiving day that it was not her town home (she had bought it) and she was not taking her meds.  Cut them in half when she was getting "spacy".  Started to wonder if risperidone was helpful.  Friends noted a difference since her daughter was making sure she was taking her pills.  Driving was poor--DMV letter was written by Dr. Jonny Ruiz and her license was revoked.  Wed she was going to leave the house.  Dtr had to take the keys and mail in the license--this is about a month ago now.  Has been off risperidone since that time.  Is very angry about not driving anymore.    Sleeps only a  few hours and gets up like she slept all night.  No longer tries to wake her daughter.  Goes back and forth between bed and living room.    Had fall when on sleeping pill.  Daughter unsure if that was [place] or remeron.  Sometimes z-quil helps her sleep and sometimes not.  Does nap here and there.  Her daughter works in an [employer], counselling 12-730pm.    Used to read a lot but no longer does.    Refused to take cholesterol medication.    Prior to 2003, daughter was unaware of any problems with depression.  Friends noticed a change in her mood when celexa was started.    Review of Systems:  Review of Systems  Constitutional: Negative for fever, chills, weight loss and malaise/fatigue.  Eyes: Negative for blurred vision.  Respiratory: Negative for cough and shortness of breath.   Cardiovascular: Negative for chest pain and leg swelling.  Gastrointestinal: Negative for heartburn and constipation.  Genitourinary: Positive for urgency and frequency. Negative for dysuria.       Incontinence  Musculoskeletal: Negative for falls.       Bilateral foot pain  Skin: Negative for rash.  Neurological: Negative for loss of consciousness and weakness.  Psychiatric/Behavioral: Positive for depression and memory loss. The patient has insomnia.  Past Medical History  Diagnosis Date  . HYPERLIPIDEMIA 10/26/2007  . DEMENTIA 08/27/2009  . HYPERTENSION 09/01/2008  . UTI 05/18/2009  . MENOPAUSAL DISORDER 02/25/2010  . OSTEOPENIA 02/25/2008  . INSOMNIA-SLEEP DISORDER-UNSPEC 10/21/2009  . FATIGUE 10/21/2009  . Memory loss 02/25/2008  . FACIAL FLUSHING 10/21/2009  . Dysuria 04/12/2010  . COLONIC POLYPS, HX OF 02/25/2008  . DIVERTICULOSIS, COLON 09/02/2010    Past Surgical History  Procedure Laterality Date  . Cholecystectomy  1970    s/p  . Appendectomy  1970  . Abdominal hysterectomy  1985  . S/p bilat bunionectomy  2005  . S/p right mastoidectomy      at 36 mo old    Social History:   reports that she has  quit smoking. She does not have any smokeless tobacco history on file. She reports that she does not drink alcohol. Her drug history is not on file.  Family History  Problem Relation Age of Onset  . Cancer Sister     breast cancer  . Diabetes Sister   . Cancer Brother     colon cancer  . Hyperlipidemia Brother   . Heart disease Mother   . Alcohol abuse Father   . Heart disease Sister   . Heart attack Sister     Medications: Patient's Medications  New Prescriptions   No medications on file  Previous Medications   ASPIRIN 81 MG EC TABLET    Take 81 mg by mouth daily.     CALCIUM CITRATE-VITAMIN D (CITRACAL+D) 315-200 MG-UNIT PER TABLET    Take 1 tablet by mouth 2 (two) times daily.     CETIRIZINE (ZYRTEC) 5 MG TABLET    Take 1 tablet (5 mg total) by mouth daily.   CITALOPRAM (CELEXA) 10 MG TABLET    Take 10 mg by mouth daily.   CLONAZEPAM (KLONOPIN) 2 MG TABLET    1/2 - 1 tab by mouth at bedtime   CRANBERRY-VITAMIN C-VITAMIN E (CRANBERRY PLUS VITAMIN C) 140-100-3 MG-MG-UNIT CAPS    Take by mouth daily.     DONEPEZIL (ARICEPT) 10 MG TABLET    Take 1 tablet (10 mg total) by mouth daily.   GARLIC (GARLIQUE) 400 MG TBEC    Take by mouth daily.     IRBESARTAN (AVAPRO) 150 MG TABLET    Take 1 tablet (150 mg total) by mouth at bedtime.   MIRTAZAPINE (REMERON) 15 MG TABLET    Take 1 tablet (15 mg total) by mouth at bedtime.   MULTIPLE VITAMIN (MULTIVITAMIN) CAPSULE    Take 1 capsule by mouth daily.     NITROFURANTOIN, MACROCRYSTAL-MONOHYDRATE, (MACROBID) 100 MG CAPSULE    Take 1 capsule (100 mg total) by mouth 2 (two) times daily.   NUTRITIONAL SUPPLEMENTS (ESTROVEN) TABS    Take by mouth daily.     OMEGA-3 FATTY ACIDS (FISH OIL) 1000 MG CAPS    Take by mouth daily.     PSYLLIUM-CALCIUM (METAMUCIL PLUS CALCIUM) CAPS    Take by mouth. Use as directed    RISPERIDONE (RISPERDAL) 0.25 MG TABLET    Take 1 tablet (0.25 mg total) by mouth 2 (two) times daily.   ROSUVASTATIN (CRESTOR) 40 MG TABLET     Take 1 tablet (40 mg total) by mouth daily.   ZOLPIDEM (AMBIEN) 5 MG TABLET    Take 1 tablet (5 mg total) by mouth at bedtime as needed for sleep.  Modified Medications   No medications on file  Discontinued Medications   No  medications on file     Physical Exam: Filed Vitals:   03/29/13 0825  BP: 130/82  Pulse: 71  Temp: 97.4 F (36.3 C)  TempSrc: Oral  Resp: 18  Height: 5' 3.98" (1.625 m)  Weight: 157 lb 6.4 oz (71.396 kg)  SpO2: 96%  Physical Exam  Constitutional: She appears well-developed and well-nourished.  HENT:  Head: Normocephalic and atraumatic.  Right Ear: External ear normal.  Left Ear: External ear normal.  Nose: Nose normal.  Mouth/Throat: Oropharynx is clear and moist. No oropharyngeal exudate.  Eyes: Conjunctivae and EOM are normal. Pupils are equal, round, and reactive to light.  Neck: Normal range of motion. Neck supple. No JVD present.  Cardiovascular: Normal rate, regular rhythm, normal heart sounds and intact distal pulses.   Pulmonary/Chest: Effort normal and breath sounds normal. No respiratory distress.  Abdominal: Soft. Bowel sounds are normal. She exhibits no distension and no mass. There is no tenderness.  Musculoskeletal: Normal range of motion.  Feet are tender, deformities, bunions present  Neurological: She is alert.  Oriented to person only  Skin: Skin is warm and dry.  Psychiatric: She has a normal mood and affect. Her behavior is normal. Judgment and thought content normal.     Labs reviewed: Basic Metabolic Panel:  Recent Labs  16/10/96 0921  NA 139  K 3.8  CL 106  CO2 23  GLUCOSE 120*  BUN 11  CREATININE 0.9  CALCIUM 9.2  TSH 2.43   Liver Function Tests:  Recent Labs  12/26/12 0921  AST 23  ALT 19  ALKPHOS 99  BILITOT 0.4  PROT 7.0  ALBUMIN 3.5  CBC:  Recent Labs  12/26/12 0921  WBC 9.6  NEUTROABS 5.5  HGB 14.4  HCT 42.9  MCV 91.7  PLT 319.0   Lipid Panel:  Recent Labs  12/26/12 0921  CHOL  280*  HDL 54.70  TRIG 97.0  CHOLHDL 5  LDLDIRECT 211.2    Assessment/Plan 1. Insomnia -cont celexa and risperdal -will d/c ambien and taper off klonopin due to their potentially worsening effects on cognition  2. Depression -seems we need to manage this first as pt quite depressed and could be the reason for her psychosis and poor sleep -will try to increase celexa dose--if not effective, would change ssris  3. Urinary incontinence -discussed encouraging regular use of bathroom to prevent incontinence and try to avoid the meds for this due to their side effects on cognition in geriatric pts  4. HYPERTENSION -cont current meds and monitor  5. Memory loss, long term -seems she has AD vs. Vascular dementia made worse by depression--check CT brain for more info, B12 level also (other labs have already been done by previous PCP) -will plan to try to add namenda if labs are unremarkable   Labs/tests ordered: Orders Placed This Encounter  Procedures  . CT Head Wo Contrast    EPIC ORDER/[id number] MC/CLC/DOROTHY    Standing Status: Future     Number of Occurrences: 1     Standing Expiration Date: 06/28/2014    Order Specific Question:  Reason for Exam (SYMPTOM  OR DIAGNOSIS REQUIRED)    Answer:  long term memory loss, hallucinations, depression    Order Specific Question:  Preferred imaging location?    Answer:  [facility]  . B12 and Folate Panel  . Basic metabolic panel  . CBC with Differential   Next appt: 1 month after labs and imaging done re: memory

## 2013-03-30 LAB — BASIC METABOLIC PANEL
BUN/Creatinine Ratio: 21 (ref 11–26)
BUN: 18 mg/dL (ref 8–27)
CO2: 26 mmol/L (ref 18–29)
Calcium: 9.9 mg/dL (ref 8.6–10.2)
Chloride: 101 mmol/L (ref 97–108)
Creatinine, Ser: 0.85 mg/dL (ref 0.57–1.00)
GFR calc Af Amer: 75 mL/min/{1.73_m2} (ref >59)
GFR calc non Af Amer: 65 mL/min/{1.73_m2} (ref >59)
Glucose: 101 mg/dL — ABNORMAL HIGH (ref 65–99)
Potassium: 4.2 mmol/L (ref 3.5–5.2)
Sodium: 142 mmol/L (ref 134–144)

## 2013-03-30 LAB — B12 AND FOLATE PANEL
Folate: 18.1 ng/mL (ref >3.0)
Vitamin B-12: 575 pg/mL (ref 211–946)

## 2013-03-30 LAB — CBC WITH DIFFERENTIAL/PLATELET
Basophils Absolute: 0 10*3/uL (ref 0.0–0.2)
Basos: 0 % (ref 0–3)
Eos: 3 % (ref 0–5)
Eosinophils Absolute: 0.2 10*3/uL (ref 0.0–0.4)
HCT: 45.3 % (ref 34.0–46.6)
Hemoglobin: 15 g/dL (ref 11.1–15.9)
Immature Grans (Abs): 0 10*3/uL (ref 0.0–0.1)
Immature Granulocytes: 0 % (ref 0–2)
Lymphocytes Absolute: 3 10*3/uL (ref 0.7–3.1)
Lymphs: 40 % (ref 14–46)
MCH: 29.2 pg (ref 26.6–33.0)
MCHC: 33.1 g/dL (ref 31.5–35.7)
MCV: 88 fL (ref 79–97)
Monocytes Absolute: 0.7 10*3/uL (ref 0.1–0.9)
Monocytes: 9 % (ref 4–12)
Neutrophils Absolute: 3.7 10*3/uL (ref 1.4–7.0)
Neutrophils Relative %: 48 % (ref 40–74)
RBC: 5.14 x10E6/uL (ref 3.77–5.28)
RDW: 13.5 % (ref 12.3–15.4)
WBC: 7.6 10*3/uL (ref 3.4–10.8)

## 2013-04-02 ENCOUNTER — Ambulatory Visit
Admission: RE | Admit: 2013-04-02 | Discharge: 2013-04-02 | Disposition: A | Discharge status: Home / Self Care | Payer: Medicare Other | Source: Ambulatory Visit | Attending: Internal Medicine | Admitting: Internal Medicine

## 2013-04-02 DIAGNOSIS — R413 Other amnesia: Secondary | ICD-10-CM

## 2013-04-29 ENCOUNTER — Encounter: Payer: Self-pay | Admitting: Internal Medicine

## 2013-04-29 ENCOUNTER — Ambulatory Visit (INDEPENDENT_AMBULATORY_CARE_PROVIDER_SITE_OTHER): Payer: BC Managed Care – PPO | Admitting: Internal Medicine

## 2013-04-29 VITALS — BP 122/72 | HR 68 | Temp 97.6°F | Wt 164.0 lb

## 2013-04-29 DIAGNOSIS — H9190 Unspecified hearing loss, unspecified ear: Secondary | ICD-10-CM

## 2013-04-29 DIAGNOSIS — F32A Depression, unspecified: Secondary | ICD-10-CM

## 2013-04-29 DIAGNOSIS — F329 Major depressive disorder, single episode, unspecified: Secondary | ICD-10-CM

## 2013-04-29 DIAGNOSIS — E785 Hyperlipidemia, unspecified: Secondary | ICD-10-CM

## 2013-04-29 DIAGNOSIS — I10 Essential (primary) hypertension: Secondary | ICD-10-CM

## 2013-04-29 DIAGNOSIS — F0280 Dementia in other diseases classified elsewhere without behavioral disturbance: Secondary | ICD-10-CM

## 2013-04-29 DIAGNOSIS — H9193 Unspecified hearing loss, bilateral: Secondary | ICD-10-CM

## 2013-04-29 DIAGNOSIS — G4700 Insomnia, unspecified: Secondary | ICD-10-CM

## 2013-04-29 DIAGNOSIS — R32 Unspecified urinary incontinence: Secondary | ICD-10-CM

## 2013-04-29 DIAGNOSIS — F3289 Other specified depressive episodes: Secondary | ICD-10-CM

## 2013-04-29 NOTE — Progress Notes (Signed)
Patient ID: Rita Jones, female   DOB: 04/21/1931, 79 y.o.   MRN: [id number] Location:  [facility] / [place] [facility]  Code Status: DNR   No Known Allergies  Chief Complaint  Patient presents with  . Medical Managment of Chronic Issues    1 month follow-up, no specific concerns     HPI: Patient is a 79 y.o. white female seen in the office today for  Says everything is fine.   Blood pressure remains good.   Sleeping ok.   Her daughter mentions hallucinations overnight She's had 2 recent headaches--left side of head.   She is HOH and her daughter would like her evaluated for hearing aides if she can get her to go  Review of Systems:  Review of Systems  Constitutional: Negative for fever.  HENT: Positive for hearing loss.   Eyes: Negative for blurred vision.  Respiratory: Negative for shortness of breath.   Cardiovascular: Negative for chest pain.  Gastrointestinal: Negative for abdominal pain, diarrhea, constipation, blood in stool and melena.  Genitourinary: Negative for dysuria.       Incontinence  Musculoskeletal:       Foot pain ever since bunion surgery  Skin: Negative for rash.  Neurological: Negative for dizziness.  Endo/Heme/Allergies: Does not bruise/bleed easily.  Psychiatric/Behavioral: Positive for depression, hallucinations and memory loss. The patient has insomnia.     Past Medical History  Diagnosis Date  . HYPERLIPIDEMIA 10/26/2007  . DEMENTIA 08/27/2009  . HYPERTENSION 09/01/2008  . UTI 05/18/2009  . MENOPAUSAL DISORDER 02/25/2010  . OSTEOPENIA 02/25/2008  . INSOMNIA-SLEEP DISORDER-UNSPEC 10/21/2009  . FATIGUE 10/21/2009  . Memory loss 02/25/2008  . FACIAL FLUSHING 10/21/2009  . Dysuria 04/12/2010  . COLONIC POLYPS, HX OF 02/25/2008  . DIVERTICULOSIS, COLON 09/02/2010    Past Surgical History  Procedure Laterality Date  . Cholecystectomy  1970    s/p  . Appendectomy  1970  . Abdominal hysterectomy  1985  . S/p bilat bunionectomy   2005  . S/p right mastoidectomy      at 93 mo old    Social History:   reports that she has quit smoking. She does not have any smokeless tobacco history on file. She reports that she does not drink alcohol. Her drug history is not on file.  Family History  Problem Relation Age of Onset  . Cancer Sister     breast cancer  . Diabetes Sister   . Cancer Brother     colon cancer  . Hyperlipidemia Brother   . Heart disease Mother   . Alcohol abuse Father   . Heart disease Sister   . Heart attack Sister     Medications: Patient's Medications  New Prescriptions   No medications on file  Previous Medications   ASPIRIN 81 MG EC TABLET    Take 81 mg by mouth daily.     CITALOPRAM (CELEXA) 10 MG TABLET    Take 10 mg by mouth daily.   DONEPEZIL (ARICEPT) 10 MG TABLET    Take 1 tablet (10 mg total) by mouth daily.   IRBESARTAN (AVAPRO) 150 MG TABLET    Take 1 tablet (150 mg total) by mouth at bedtime.   ROSUVASTATIN (CRESTOR) 40 MG TABLET    Take 1 tablet (40 mg total) by mouth daily.  Modified Medications   No medications on file  Discontinued Medications   CALCIUM CITRATE-VITAMIN D (CITRACAL+D) 315-200 MG-UNIT PER TABLET    Take 1 tablet by mouth 2 (  two) times daily.   CRANBERRY-VITAMIN C-VITAMIN E (CRANBERRY PLUS VITAMIN C) 140-100-3 MG-MG-UNIT CAPS    Take by mouth daily.     ZOLPIDEM (AMBIEN) 5 MG TABLET    Take 1 tablet (5 mg total) by mouth at bedtime as needed for sleep.   Physical Exam: Filed Vitals:   04/29/13 0839  BP: 122/72  Pulse: 68  Temp: 97.6 F (36.4 C)  TempSrc: Oral  Weight: 164 lb (74.39 kg)  SpO2: 95%  Physical Exam  Constitutional: She appears well-developed and well-nourished. No distress.  Cardiovascular: Normal rate, regular rhythm, normal heart sounds and intact distal pulses.   Pulmonary/Chest: Effort normal and breath sounds normal. No respiratory distress.  Abdominal: Soft. Bowel sounds are normal. She exhibits no distension. There is no  tenderness.  Musculoskeletal: Normal range of motion. She exhibits no edema and no tenderness.  Walks like her feet hurt, but denies  Neurological: She is alert.  Oriented to person only  Skin: Skin is warm and dry.    Labs reviewed: Basic Metabolic Panel:  Recent Labs  54/09/81 0921 03/29/13 0944  NA 139 142  K 3.8 4.2  CL 106 101  CO2 23 26  GLUCOSE 120* 101*  BUN 11 18  CREATININE 0.9 0.85  CALCIUM 9.2 9.9  TSH 2.43  --    Liver Function Tests:  Recent Labs  12/26/12 0921  AST 23  ALT 19  ALKPHOS 99  BILITOT 0.4  PROT 7.0  ALBUMIN 3.5   No results found for this basename: LIPASE, AMYLASE,  in the last 8760 hours No results found for this basename: AMMONIA,  in the last 8760 hours CBC:  Recent Labs  12/26/12 0921 03/29/13 0944  WBC 9.6 7.6  NEUTROABS 5.5 3.7  HGB 14.4 15.0  HCT 42.9 45.3  MCV 91.7 88  PLT 319.0  --    Lipid Panel:  Recent Labs  12/26/12 0921  CHOL 280*  HDL 54.70  TRIG 97.0  CHOLHDL 5  LDLDIRECT 211.2   Assessment/Plan 1. Alzheimer's disease -reversible cause workup was unremarkable with nl tsh previously, b12, folate -CT brain shows atrophy and slight enlargement of ventricles, but no significant ischemia or infarcts ruling out vascular etiology -continue aricept -will start namenda next time when the extended release starter packs are readily available -I provided her daughter with a caregiving guide and the 36-hour day book  2. Depression -pt denies any symptoms of anything actually -daughter notes some depressive symptoms and hallucinations which are likely related to the depression -she is using low dose risperdal some mornings for obsessive compulsive tendencies  3. Insomnia -has some hallucinations overnight and talking aloud to people who are not there -using risperdal prn  4. Essential hypertension, benign -at goal  5. Urinary incontinence -intermittent, does not have typical NPH symptoms, continue to  monitor, avoid anticholinergics that will worsen her dementia and counteract her aricept  6. Hyperlipidemia LDL goal < 100 -begun on statin last visit and baby asa--is taking them when given by her daughter -will f/u lipids in 3-6 mos  7. Hearing loss, bilateral -denies any problem here, also, but has difficulty hearing my questions during the visit, and her daughter notes she turns up the tv and does not hear a lot of what is said at home -referral given to her daughter to [facility] if pt will go--she did not agree with me about her hearing problem  Labs/tests ordered:  Audiology referral Next appt:  3 mos

## 2013-04-29 NOTE — Patient Instructions (Signed)
Will start namenda at next visit

## 2013-05-28 ENCOUNTER — Other Ambulatory Visit: Payer: Self-pay | Admitting: *Deleted

## 2013-05-28 ENCOUNTER — Telehealth: Payer: Self-pay | Admitting: *Deleted

## 2013-05-28 DIAGNOSIS — N390 Urinary tract infection, site not specified: Secondary | ICD-10-CM

## 2013-05-28 NOTE — Telephone Encounter (Signed)
Patient's daughter(Cynthia Marlana Salvage) walked into the office to request a UA/Culture for her mother for a possible bladder infection. This was acknowledged and agreed by Dr. Renato Gails.

## 2013-06-03 ENCOUNTER — Encounter: Payer: Self-pay | Admitting: Internal Medicine

## 2013-06-28 ENCOUNTER — Ambulatory Visit: Payer: Self-pay | Admitting: Internal Medicine

## 2013-07-31 ENCOUNTER — Encounter: Payer: Self-pay | Admitting: Internal Medicine

## 2013-08-01 ENCOUNTER — Ambulatory Visit (INDEPENDENT_AMBULATORY_CARE_PROVIDER_SITE_OTHER): Payer: BC Managed Care – PPO | Admitting: Internal Medicine

## 2013-08-01 ENCOUNTER — Encounter: Payer: Self-pay | Admitting: Internal Medicine

## 2013-08-01 VITALS — BP 136/82 | HR 75 | Temp 97.4°F | Resp 18 | Ht 63.0 in | Wt 161.2 lb

## 2013-08-01 DIAGNOSIS — H9193 Unspecified hearing loss, bilateral: Secondary | ICD-10-CM

## 2013-08-01 DIAGNOSIS — H9190 Unspecified hearing loss, unspecified ear: Secondary | ICD-10-CM

## 2013-08-01 DIAGNOSIS — I10 Essential (primary) hypertension: Secondary | ICD-10-CM

## 2013-08-01 DIAGNOSIS — F333 Major depressive disorder, recurrent, severe with psychotic symptoms: Secondary | ICD-10-CM

## 2013-08-01 DIAGNOSIS — F0280 Dementia in other diseases classified elsewhere without behavioral disturbance: Secondary | ICD-10-CM

## 2013-08-01 DIAGNOSIS — R32 Unspecified urinary incontinence: Secondary | ICD-10-CM

## 2013-08-01 DIAGNOSIS — G4700 Insomnia, unspecified: Secondary | ICD-10-CM

## 2013-08-01 DIAGNOSIS — G309 Alzheimer's disease, unspecified: Principal | ICD-10-CM

## 2013-08-01 MED ORDER — MEMANTINE HCL ER 28 MG PO CP24: 1.0000 | ORAL_CAPSULE | Freq: Every day | ORAL | Status: DC

## 2013-08-01 MED ORDER — MEMANTINE HCL ER 7 & 14 & 21 &28 MG PO CP24: ORAL_CAPSULE | ORAL | Status: DC

## 2013-08-01 NOTE — Progress Notes (Signed)
Patient ID: Rita Jones, female   DOB: [birth year], 80 y.o.   MRN: [id number]   Location:  [facility] / [place] [facility]  Code Status: DNR   No Known Allergies  Chief Complaint  Patient presents with  . Follow-up    HPI: Patient is a 80 y.o. white female seen in the office today for f/u of chronic conditions especially Alzheimer's disease, depression with psychosis.  She is here with her daughter, Rita Jones who has written me a letter to update me on her mom's conditions.   Pt responses:  No pain.  Sleeping ok.  Denies sadness or depression--says she is very happy.  Bowels are moving.  Denies difficulty controlling her bladder.  Appetite is on and off, she says.   [name]'s history: see note she sent me.  Ongoing hallucinations, insomnia, not doing her bathing regularly.  Urinary incontinence is worse.  Seemed risperdal made her want to go back to [place].      Review of Systems:  Review of Systems  Constitutional: Negative for fever, chills and malaise/fatigue.  HENT: Positive for hearing loss. Negative for congestion.        Refuses hearing eval  Eyes: Negative for blurred vision.       Needs new glasses but won't go  Respiratory: Negative for shortness of breath.   Cardiovascular: Negative for chest pain.  Gastrointestinal: Negative for heartburn, abdominal pain and constipation.  Genitourinary: Positive for urgency and frequency. Negative for dysuria and hematuria.  Musculoskeletal: Negative for falls and myalgias.  Skin: Negative for rash.  Neurological: Negative for dizziness, loss of consciousness, weakness and headaches.  Endo/Heme/Allergies: Bruises/bleeds easily.  Psychiatric/Behavioral: Positive for depression, hallucinations and memory loss. The patient has insomnia. The patient is not nervous/anxious.      Past Medical History  Diagnosis Date  . HYPERLIPIDEMIA 10/26/2007  . DEMENTIA 08/27/2009  . HYPERTENSION 09/01/2008  . UTI 05/18/2009    . MENOPAUSAL DISORDER 02/25/2010  . OSTEOPENIA 02/25/2008  . INSOMNIA-SLEEP DISORDER-UNSPEC 10/21/2009  . FATIGUE 10/21/2009  . Memory loss 02/25/2008  . FACIAL FLUSHING 10/21/2009  . Dysuria 04/12/2010  . COLONIC POLYPS, HX OF 02/25/2008  . DIVERTICULOSIS, COLON 09/02/2010    Past Surgical History  Procedure Laterality Date  . Cholecystectomy  1970    s/p  . Appendectomy  1970  . Abdominal hysterectomy  1985  . S/p bilat bunionectomy  2005  . S/p right mastoidectomy      at 42 mo old    Social History:   reports that she has quit smoking. She does not have any smokeless tobacco history on file. She reports that she does not drink alcohol. Her drug history is not on file.  Family History  Problem Relation Age of Onset  . Cancer Sister     breast cancer  . Diabetes Sister   . Cancer Brother     colon cancer  . Hyperlipidemia Brother   . Heart disease Mother   . Alcohol abuse Father   . Heart disease Sister   . Heart attack Sister     Medications: Patient's Medications  New Prescriptions   No medications on file  Previous Medications   ASPIRIN 81 MG EC TABLET    Take 81 mg by mouth daily.     CITALOPRAM (CELEXA) 10 MG TABLET    Take 10 mg by mouth daily.   DONEPEZIL (ARICEPT) 10 MG TABLET    Take 1 tablet (10 mg total) by mouth daily.  IRBESARTAN (AVAPRO) 150 MG TABLET    Take 1 tablet (150 mg total) by mouth at bedtime.   ROSUVASTATIN (CRESTOR) 40 MG TABLET    Take 1 tablet (40 mg total) by mouth daily.  Modified Medications   No medications on file  Discontinued Medications   No medications on file     Physical Exam: Filed Vitals:   08/01/13 0816  BP: 136/82  Pulse: 75  Temp: 97.4 F (36.3 C)  TempSrc: Oral  Resp: 18  Height: 5\' 3"  (1.6 m)  Weight: 161 lb 3.2 oz (73.12 kg)  SpO2: 95%  Physical Exam  Constitutional: She appears well-developed and well-nourished. No distress.  HENT:  Head: Normocephalic and atraumatic.  Eyes: EOM are normal. Pupils are equal,  round, and reactive to light.  Cardiovascular: Normal rate, regular rhythm, normal heart sounds and intact distal pulses.   Pulmonary/Chest: Effort normal and breath sounds normal. No respiratory distress.  Abdominal: Soft. Bowel sounds are normal. She exhibits no distension. There is no tenderness.  Musculoskeletal: Normal range of motion.  Wide based gait  Neurological: She is alert. No cranial nerve deficit.  Oriented to person and place not time  Skin: Skin is warm and dry.  Psychiatric:  Flat affect     Labs reviewed: Basic Metabolic Panel:  Recent Labs  45/40/9804/11/05 0921 03/29/13 0944  NA 139 142  K 3.8 4.2  CL 106 101  CO2 23 26  GLUCOSE 120* 101*  BUN 11 18  CREATININE 0.9 0.85  CALCIUM 9.2 9.9  TSH 2.43  --    Liver Function Tests:  Recent Labs  12/26/12 0921  AST 23  ALT 19  ALKPHOS 99  BILITOT 0.4  PROT 7.0  ALBUMIN 3.5   No results found for this basename: LIPASE, AMYLASE,  in the last 8760 hours No results found for this basename: AMMONIA,  in the last 8760 hours CBC:  Recent Labs  12/26/12 0921 03/29/13 0944  WBC 9.6 7.6  NEUTROABS 5.5 3.7  HGB 14.4 15.0  HCT 42.9 45.3  MCV 91.7 88  PLT 319.0  --    Lipid Panel:  Recent Labs  12/26/12 0921  CHOL 280*  HDL 54.70  TRIG 97.0  CHOLHDL 5  LDLDIRECT 211.2   Assessment/Plan 1. Alzheimer's disease -cont donepezil, add namenda -recommended trying a caregiver again - Memantine HCl ER (NAMENDA XR TITRATION PACK) 7 & 14 & 21 &28 MG CP24; One capsule daily as per titration instructions  Dispense: 28 capsule; Refill: 0 - Memantine HCl ER (NAMENDA XR) 28 MG CP24; Take 28 mg by mouth daily. When complete titration pack  Dispense: 30 capsule; Refill: 3  2. Severe recurrent depression with psychosis -discussed that an antipsychotic would be most helpful for this, but has not done well with risperdal so her daughter does not want to try an alternative at this point  3. Insomnia -due to  hallucinations it seems -no changes today  4. Urinary incontinence -urine sample obtained today to r/o UTI as this has gotten worse lately as have hallucinations  5. Essential hypertension, benign -controlled with current meds  6. Hearing loss, bilateral -refuses to get hearing test and denies any problem with hearing -daughter has not been able to convince her to go  Labs/tests ordered: UA c+s Next appt:  2 mos

## 2013-08-01 NOTE — Patient Instructions (Signed)
Try namenda XR titration pack and then 28mg  pill daily thereafter.   We will let you know the results of your urine sample.

## 2013-08-02 ENCOUNTER — Encounter: Payer: Self-pay | Admitting: Internal Medicine

## 2013-08-02 LAB — MICROSCOPIC EXAMINATION
Epithelial Cells (non renal): NONE SEEN /hpf (ref 0–10)
WBC, UA: >30 /hpf — AB (ref 0–?)

## 2013-08-02 LAB — URINALYSIS, ROUTINE W REFLEX MICROSCOPIC
Bilirubin, UA: NEGATIVE
Glucose, UA: NEGATIVE
Ketones, UA: NEGATIVE
Nitrite, UA: POSITIVE — AB
Specific Gravity, UA: 1.019 (ref 1.005–1.030)
Urobilinogen, Ur: 1 mg/dL (ref 0.0–1.9)
pH, UA: 8 — ABNORMAL HIGH (ref 5.0–7.5)

## 2013-08-03 LAB — URINE CULTURE

## 2013-08-14 ENCOUNTER — Encounter: Payer: Self-pay | Admitting: Internal Medicine

## 2013-08-15 ENCOUNTER — Encounter: Payer: Self-pay | Admitting: Internal Medicine

## 2013-08-16 ENCOUNTER — Encounter: Payer: Self-pay | Admitting: Internal Medicine

## 2013-08-17 ENCOUNTER — Encounter: Payer: Self-pay | Admitting: Internal Medicine

## 2013-08-19 ENCOUNTER — Encounter: Payer: Self-pay | Admitting: Internal Medicine

## 2013-08-19 DIAGNOSIS — N390 Urinary tract infection, site not specified: Secondary | ICD-10-CM

## 2013-08-19 MED ORDER — CIPROFLOXACIN HCL 500 MG PO TABS: 500.0000 mg | ORAL_TABLET | Freq: Two times a day (BID) | ORAL | Status: DC

## 2013-08-19 NOTE — Telephone Encounter (Signed)
rx has been re-sent to the pharmacy.  Not sure why they didn't get it.  Sorry about that.

## 2013-08-20 ENCOUNTER — Encounter: Payer: Self-pay | Admitting: Internal Medicine

## 2013-08-27 ENCOUNTER — Other Ambulatory Visit: Payer: Self-pay | Admitting: *Deleted

## 2013-08-27 MED ORDER — DONEPEZIL HCL 10 MG PO TABS: 10.0000 mg | ORAL_TABLET | Freq: Every day | ORAL | Status: DC

## 2013-09-03 ENCOUNTER — Encounter: Payer: Self-pay | Admitting: Internal Medicine

## 2013-09-04 ENCOUNTER — Other Ambulatory Visit: Payer: Self-pay

## 2013-09-04 MED ORDER — IRBESARTAN 150 MG PO TABS: 150.0000 mg | ORAL_TABLET | Freq: Every day | ORAL | Status: DC

## 2013-09-04 MED ORDER — CITALOPRAM HYDROBROMIDE 10 MG PO TABS: 10.0000 mg | ORAL_TABLET | Freq: Every day | ORAL | Status: DC

## 2013-09-04 NOTE — Telephone Encounter (Signed)
I called Rita Jones at work and discussed refill. [facility]

## 2013-09-17 ENCOUNTER — Encounter: Payer: Self-pay | Admitting: Internal Medicine

## 2013-09-18 NOTE — Telephone Encounter (Signed)
Letter was printed and mailed to address provided by patient's daughter

## 2013-10-18 ENCOUNTER — Ambulatory Visit (INDEPENDENT_AMBULATORY_CARE_PROVIDER_SITE_OTHER): Payer: BC Managed Care – PPO | Admitting: Internal Medicine

## 2013-10-18 ENCOUNTER — Encounter: Payer: Self-pay | Admitting: Internal Medicine

## 2013-10-18 VITALS — BP 120/68 | HR 70 | Temp 97.8°F | Ht 63.0 in | Wt 167.4 lb

## 2013-10-18 DIAGNOSIS — G4700 Insomnia, unspecified: Secondary | ICD-10-CM

## 2013-10-18 DIAGNOSIS — E785 Hyperlipidemia, unspecified: Secondary | ICD-10-CM | POA: Insufficient documentation

## 2013-10-18 DIAGNOSIS — I10 Essential (primary) hypertension: Secondary | ICD-10-CM

## 2013-10-18 DIAGNOSIS — H9190 Unspecified hearing loss, unspecified ear: Secondary | ICD-10-CM

## 2013-10-18 DIAGNOSIS — Z800 Family history of malignant neoplasm of digestive organs: Secondary | ICD-10-CM

## 2013-10-18 DIAGNOSIS — H9193 Unspecified hearing loss, bilateral: Secondary | ICD-10-CM

## 2013-10-18 DIAGNOSIS — Z803 Family history of malignant neoplasm of breast: Secondary | ICD-10-CM

## 2013-10-18 DIAGNOSIS — F0280 Dementia in other diseases classified elsewhere without behavioral disturbance: Secondary | ICD-10-CM

## 2013-10-18 DIAGNOSIS — F333 Major depressive disorder, recurrent, severe with psychotic symptoms: Secondary | ICD-10-CM | POA: Insufficient documentation

## 2013-10-18 DIAGNOSIS — R32 Unspecified urinary incontinence: Secondary | ICD-10-CM

## 2013-10-18 DIAGNOSIS — G309 Alzheimer's disease, unspecified: Principal | ICD-10-CM
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
## 2013-10-22 ENCOUNTER — Encounter: Payer: Self-pay | Admitting: Internal Medicine

## 2013-11-01 ENCOUNTER — Ambulatory Visit (HOSPITAL_COMMUNITY)
Admission: RE | Admit: 2013-11-01 | Discharge: 2013-11-01 | Disposition: A | Discharge status: Home / Self Care | Payer: Medicare Other | Source: Ambulatory Visit | Attending: Internal Medicine | Admitting: Internal Medicine

## 2013-11-01 ENCOUNTER — Other Ambulatory Visit: Payer: Self-pay | Admitting: Internal Medicine

## 2013-11-01 DIAGNOSIS — Z1231 Encounter for screening mammogram for malignant neoplasm of breast: Secondary | ICD-10-CM | POA: Insufficient documentation

## 2013-11-01 DIAGNOSIS — Z803 Family history of malignant neoplasm of breast: Secondary | ICD-10-CM

## 2013-12-10 ENCOUNTER — Encounter: Payer: Self-pay | Admitting: Internal Medicine

## 2013-12-15 ENCOUNTER — Other Ambulatory Visit: Payer: Self-pay | Admitting: Internal Medicine

## 2013-12-19 ENCOUNTER — Encounter: Payer: Self-pay | Admitting: Internal Medicine

## 2013-12-19 ENCOUNTER — Ambulatory Visit (INDEPENDENT_AMBULATORY_CARE_PROVIDER_SITE_OTHER): Payer: BC Managed Care – PPO | Admitting: Internal Medicine

## 2013-12-19 VITALS — BP 110/68 | HR 79 | Temp 97.6°F | Ht 64.0 in | Wt 160.0 lb

## 2013-12-19 DIAGNOSIS — H9193 Unspecified hearing loss, bilateral: Secondary | ICD-10-CM

## 2013-12-19 DIAGNOSIS — G309 Alzheimer's disease, unspecified: Principal | ICD-10-CM

## 2013-12-19 DIAGNOSIS — R32 Unspecified urinary incontinence: Secondary | ICD-10-CM

## 2013-12-19 DIAGNOSIS — F0280 Dementia in other diseases classified elsewhere without behavioral disturbance: Secondary | ICD-10-CM

## 2013-12-19 DIAGNOSIS — E785 Hyperlipidemia, unspecified: Secondary | ICD-10-CM

## 2013-12-19 DIAGNOSIS — H9190 Unspecified hearing loss, unspecified ear: Secondary | ICD-10-CM

## 2013-12-19 DIAGNOSIS — I10 Essential (primary) hypertension: Secondary | ICD-10-CM

## 2013-12-19 DIAGNOSIS — F333 Major depressive disorder, recurrent, severe with psychotic symptoms: Secondary | ICD-10-CM

## 2013-12-19 NOTE — Progress Notes (Signed)
Patient ID: Rita Jones, female   DOB: 11-15-1930, 80 y.o.   MRN: [id number]   Location:  [facility] [place]  Code Status: DNR   No Known Allergies  Chief Complaint  Patient presents with  . Medical Management of Chronic Issues    HPI: Patient is a 80 y.o. white female seen in the office today for medical mgt of chronic diseases seen for medical mgt of chronic diseases.  She says she has no concerns, but she has chronic foot pain and advanced Alzheimer's disease with depression.    Wandered away from the house one day.  Her daughter didn't think she would b/c of her foot pain.  She had been having an intense desire to go home for 3 weeks.  Friday, 11/22/13.  6:45pm, neighbor called her daughter and said pt had wandered off and another neighbor brought her home.  Stopped namenda for a week--stopped saying she wanted to go home, but then had a one day period of it.  Tried every other day of namenda--no problems with wanting to go home.  Did refill prescription.  Neighbor who walks dog is now spending time with pt in the afternoons.  Always [facility] Crecencio Mchil Stringer looked into facilities for her.  [employer] also will cover some home care for her if it is approved by a physician.    One of Rita Jones's brothers is listed as HCPOA before her and she is going to try to get guardianship.  Locking deadbolt when going out.    Review of Systems:  ROS  Past Medical History  Diagnosis Date  . HYPERLIPIDEMIA 10/26/2007  . DEMENTIA 08/27/2009  . HYPERTENSION 09/01/2008  . UTI 05/18/2009  . MENOPAUSAL DISORDER 02/25/2010  . OSTEOPENIA 02/25/2008  . INSOMNIA-SLEEP DISORDER-UNSPEC 10/21/2009  . FATIGUE 10/21/2009  . Memory loss 02/25/2008  . FACIAL FLUSHING 10/21/2009  . Dysuria 04/12/2010  . COLONIC POLYPS, HX OF 02/25/2008  . DIVERTICULOSIS, COLON 09/02/2010    Past Surgical History  Procedure Laterality Date  . Cholecystectomy  1970    s/p  . Appendectomy  1970  .  Abdominal hysterectomy  1985  . S/p bilat bunionectomy  2005  . S/p right mastoidectomy      at 619 mo old    Social History:   reports that she has quit smoking. She does not have any smokeless tobacco history on file. She reports that she does not drink alcohol. Her drug history is not on file.  Family History  Problem Relation Age of Onset  . Cancer Sister     breast cancer  . Diabetes Sister   . Cancer Brother     colon cancer  . Hyperlipidemia Brother   . Heart disease Mother   . Alcohol abuse Father   . Heart disease Sister   . Heart attack Sister     Medications: Patient's Medications  New Prescriptions   No medications on file  Previous Medications   ASPIRIN 81 MG EC TABLET    Take 81 mg by mouth daily.     CITALOPRAM (CELEXA) 10 MG TABLET    Take 1 tablet (10 mg total) by mouth daily.   CRANBERRY 250 MG CAPS    Take by mouth daily.   DONEPEZIL (ARICEPT) 10 MG TABLET    Take 1 tablet (10 mg total) by mouth daily.   IRBESARTAN (AVAPRO) 150 MG TABLET    Take 1 tablet (150 mg total) by mouth at  bedtime.   LORATADINE (ALLERGY) 10 MG TABLET    Take 10 mg by mouth daily.   MULTIPLE VITAMIN (MULTIVITAMIN) TABLET    Take 1 tablet by mouth daily.   NAMENDA XR 28 MG CP24    TAKE ONE CAPSULE BY MOUTH ONCE DAILY WHEN COMPLETE TITRATION PACK   ROSUVASTATIN (CRESTOR) 40 MG TABLET    Take 1 tablet (40 mg total) by mouth daily.  Modified Medications   No medications on file  Discontinued Medications   No medications on file   Physical Exam: Filed Vitals:   12/19/13 0832  BP: 110/68  Pulse: 79  Temp: 97.6 F (36.4 C)  TempSrc: Oral  Height: 5\' 4"  (1.626 m)  Weight: 160 lb (72.576 kg)  SpO2: 96%  Physical Exam  Constitutional: She appears well-developed and well-nourished. No distress.  HENT:  HOH  Eyes:  Wears glasses  Cardiovascular: Normal rate, regular rhythm, normal heart sounds and intact distal pulses.   Pulmonary/Chest: Effort normal and breath sounds normal.  No respiratory distress.  Abdominal: Soft. Bowel sounds are normal. She exhibits no distension and no mass. There is no tenderness.  Musculoskeletal:  Walks like feet hurt, widespread gait  Neurological: She is alert.  Oriented to person only  Skin: Skin is warm and dry.  Scaly skin  Psychiatric:  Mood a little brighter today    Labs reviewed: Basic Metabolic Panel:  Recent Labs  16/10/96 0921 03/29/13 0944  NA 139 142  K 3.8 4.2  CL 106 101  CO2 23 26  GLUCOSE 120* 101*  BUN 11 18  CREATININE 0.9 0.85  CALCIUM 9.2 9.9  TSH 2.43  --    Liver Function Tests:  Recent Labs  12/26/12 0921  AST 23  ALT 19  ALKPHOS 99  BILITOT 0.4  PROT 7.0  ALBUMIN 3.5  CBC:  Recent Labs  12/26/12 0921 03/29/13 0944  WBC 9.6 7.6  NEUTROABS 5.5 3.7  HGB 14.4 15.0  HCT 42.9 45.3  MCV 91.7 88  PLT 319.0  --    Lipid Panel:  Recent Labs  12/26/12 0921  CHOL 280*  HDL 54.70  TRIG 97.0  CHOLHDL 5  LDLDIRECT 211.2   Assessment/Plan 1. Alzheimer's disease -new difficulty with wandering---discussed using alarm system to help monitor, gps-type device/silver alert -cont namenda XR every other day and aricept which seem to help a bit -cont her friend Steve's visits -recommend home health aide services for a few hours a few days a week due to her dementia to help with supervising her due to fall risk, foot pain, memory loss, and to help with depression -continues to hallucinate, but did not tolerate risperdal and her psychosis is not bothersome things  2. Severe recurrent depression with psychosis -cont celexa  3. Urinary incontinence -continues  4. Essential hypertension, benign -bp at goal with current medications  5. Hyperlipidemia LDL goal < 100 -continues on crestor with benefit  6. Hearing loss, bilateral -persists, refuses help with this  Labs/tests ordered:  None today Next appt:  3 mos

## 2013-12-19 NOTE — Addendum Note (Signed)
Addended by: Kermit Balo on: 12/19/2013 05:06 PM   Modules accepted: Orders

## 2013-12-27 DIAGNOSIS — M6281 Muscle weakness (generalized): Secondary | ICD-10-CM

## 2013-12-27 DIAGNOSIS — I10 Essential (primary) hypertension: Secondary | ICD-10-CM

## 2013-12-27 DIAGNOSIS — F333 Major depressive disorder, recurrent, severe with psychotic symptoms: Secondary | ICD-10-CM

## 2013-12-27 DIAGNOSIS — G309 Alzheimer's disease, unspecified: Secondary | ICD-10-CM

## 2013-12-27 DIAGNOSIS — R269 Unspecified abnormalities of gait and mobility: Secondary | ICD-10-CM

## 2013-12-27 DIAGNOSIS — F0280 Dementia in other diseases classified elsewhere without behavioral disturbance: Secondary | ICD-10-CM

## 2013-12-27 DIAGNOSIS — Z5189 Encounter for other specified aftercare: Secondary | ICD-10-CM

## 2014-01-13 ENCOUNTER — Encounter: Payer: Self-pay | Admitting: Internal Medicine

## 2014-01-17 ENCOUNTER — Telehealth: Payer: Self-pay | Admitting: *Deleted

## 2014-01-17 NOTE — Telephone Encounter (Signed)
Spoke with daughter [name]([name]) regarding her letter to Dr. Renato Gailseed, she agreed with Dr. Renato Gailseed and would try the Myrbetriq 25 mg (samples) as soon as we receive some from the rep. Informed her that I would call her when we get some in the office.

## 2014-01-23 ENCOUNTER — Encounter: Payer: Self-pay | Admitting: Internal Medicine

## 2014-02-06 ENCOUNTER — Encounter: Payer: Self-pay | Admitting: Internal Medicine

## 2014-02-07 ENCOUNTER — Encounter: Payer: Self-pay | Admitting: Gastroenterology

## 2014-02-10 ENCOUNTER — Encounter: Payer: Self-pay | Admitting: Internal Medicine

## 2014-02-10 ENCOUNTER — Telehealth: Payer: Self-pay | Admitting: *Deleted

## 2014-02-10 NOTE — Telephone Encounter (Signed)
Patient daughter,[name], called and stated that her mother has another UTI and wants the antibiotic Refilled. Stated that she could not bring her mother in this week because there are no morning appointments. Please Advise.  Work this afternoon # [id number]

## 2014-02-12 ENCOUNTER — Other Ambulatory Visit (INDEPENDENT_AMBULATORY_CARE_PROVIDER_SITE_OTHER): Payer: Medicare Other

## 2014-02-12 DIAGNOSIS — R350 Frequency of micturition: Secondary | ICD-10-CM

## 2014-02-12 DIAGNOSIS — N390 Urinary tract infection, site not specified: Secondary | ICD-10-CM

## 2014-02-12 LAB — POCT URINALYSIS DIPSTICK
Bilirubin, UA: NEGATIVE
Glucose, UA: NEGATIVE
Ketones, UA: NEGATIVE
Nitrite, UA: POSITIVE
Protein, UA: NEGATIVE
Spec Grav, UA: 1.025
Urobilinogen, UA: 0.2
pH, UA: 6

## 2014-02-12 MED ORDER — CIPROFLOXACIN HCL 500 MG PO TABS: 500.0000 mg | ORAL_TABLET | Freq: Two times a day (BID) | ORAL | Status: DC

## 2014-02-12 NOTE — Telephone Encounter (Signed)
Rita Jones called Dr. Renato Gailseed. Patient daughter brought patient into office to leave Urine. Came back positive for infection. Rita Jones called Dr. Renato Gailseed and antibiotic called in and culture sent.

## 2014-02-14 LAB — URINE CULTURE

## 2014-02-18 ENCOUNTER — Telehealth: Payer: Self-pay

## 2014-02-18 NOTE — Telephone Encounter (Signed)
Message copied by Maurice SmallBEATTY, Tymir Terral C on Tue Feb 18, 2014 10:32 AM ------      Message from: Bufford SpikesEED, TIFFANY L      Created: Mon Feb 17, 2014  4:49 PM       She has a UTI this time.  cipro 500mg  po bid for 7 days for UTI.  Encourage fluids--6-8 8oz glasses water per day and cranberry juice (one small glass per day) ------

## 2014-02-18 NOTE — Telephone Encounter (Signed)
Patient's daughter is aware already and rx was sent in. Patient's daughter states that she already noticed an improvement

## 2014-03-20 ENCOUNTER — Encounter: Payer: Self-pay | Admitting: Internal Medicine

## 2014-03-20 ENCOUNTER — Ambulatory Visit (INDEPENDENT_AMBULATORY_CARE_PROVIDER_SITE_OTHER): Payer: Medicare Other | Admitting: Internal Medicine

## 2014-03-20 ENCOUNTER — Other Ambulatory Visit: Payer: Self-pay | Admitting: Internal Medicine

## 2014-03-20 VITALS — BP 120/78 | HR 68 | Temp 97.6°F | Resp 10 | Ht 63.5 in | Wt 163.0 lb

## 2014-03-20 DIAGNOSIS — M79671 Pain in right foot: Secondary | ICD-10-CM

## 2014-03-20 DIAGNOSIS — G309 Alzheimer's disease, unspecified: Principal | ICD-10-CM

## 2014-03-20 DIAGNOSIS — F0280 Dementia in other diseases classified elsewhere without behavioral disturbance: Secondary | ICD-10-CM

## 2014-03-20 DIAGNOSIS — F333 Major depressive disorder, recurrent, severe with psychotic symptoms: Secondary | ICD-10-CM

## 2014-03-20 DIAGNOSIS — N3941 Urge incontinence: Secondary | ICD-10-CM

## 2014-03-20 DIAGNOSIS — H9193 Unspecified hearing loss, bilateral: Secondary | ICD-10-CM

## 2014-03-20 DIAGNOSIS — M79672 Pain in left foot: Secondary | ICD-10-CM

## 2014-03-20 DIAGNOSIS — H9190 Unspecified hearing loss, unspecified ear: Secondary | ICD-10-CM

## 2014-03-20 DIAGNOSIS — M79609 Pain in unspecified limb: Secondary | ICD-10-CM

## 2014-03-20 DIAGNOSIS — I10 Essential (primary) hypertension: Secondary | ICD-10-CM

## 2014-03-20 MED ORDER — MIRABEGRON ER 25 MG PO TB24: 25.0000 mg | ORAL_TABLET | Freq: Every day | ORAL | Status: DC

## 2014-03-20 MED ORDER — MEMANTINE HCL-DONEPEZIL HCL ER 14-10 MG PO CP24: 1.0000 | ORAL_CAPSULE | Freq: Every day | ORAL | Status: AC

## 2014-03-20 NOTE — Progress Notes (Signed)
Patient ID: Rita Jones, female   DOB: 28-Aug-1930, 80 y.o.   MRN: [id number]   Location:  [facility] / [facility]  Code Status: DNR, her daughter is one of her HCPOAs  No Known Allergies  Chief Complaint  Patient presents with  . Medical Management of Chronic Issues    3 month follow-up, no recent labs (not fasting today)  . Medication Management    Discuss Namenda- ? if dose can be decreased     HPI: Patient is a 80 y.o. white female seen in the office today for med mgt chronic diseases.  Only taking namenda every other day.    Denies foot pain and says she hasn't cut them off.  No concerns at all that she has.  Says she is getting older and older.    Has been incontinent today.  Had UTI, got treated for it for a week.  Had 5-6 episodes incontinence per day.  Now has episode 1-2 times instead, and tries to wash them out but sometimes rewears if her daughter doesn't catch it.   Sometimes will wear depends and sometimes not.  Sometimes her friend can get her to wear them, sometimes wears but pants still wet.    Had wanted to go home at one point and namenda was reduced to every other day.  Was more expensive when refilled the last time.  Sounds like maybe she is in the donut hole to me.    Won't take a shower.  Friend can get her to wash her hair.    Review of Systems:  Review of Systems  Constitutional: Negative for fever and chills.  HENT: Negative for congestion.   Eyes: Negative for blurred vision.  Respiratory: Negative for shortness of breath.   Cardiovascular: Negative for chest pain.  Gastrointestinal: Negative for abdominal pain, constipation, blood in stool and melena.  Genitourinary: Positive for urgency. Negative for dysuria and frequency.  Musculoskeletal: Negative for falls and myalgias.       Foot pain  Skin: Negative for rash.  Neurological: Negative for dizziness and loss of consciousness.       Increased difficulty with  word finding  Endo/Heme/Allergies: Bruises/bleeds easily.  Psychiatric/Behavioral: Positive for depression and memory loss.       Depression improved     Past Medical History  Diagnosis Date  . HYPERLIPIDEMIA 10/26/2007  . DEMENTIA 08/27/2009  . HYPERTENSION 09/01/2008  . UTI 05/18/2009  . MENOPAUSAL DISORDER 02/25/2010  . OSTEOPENIA 02/25/2008  . INSOMNIA-SLEEP DISORDER-UNSPEC 10/21/2009  . FATIGUE 10/21/2009  . Memory loss 02/25/2008  . FACIAL FLUSHING 10/21/2009  . Dysuria 04/12/2010  . COLONIC POLYPS, HX OF 02/25/2008  . DIVERTICULOSIS, COLON 09/02/2010    Past Surgical History  Procedure Laterality Date  . Cholecystectomy  1970    s/p  . Appendectomy  1970  . Abdominal hysterectomy  1985  . S/p bilat bunionectomy  2005  . S/p right mastoidectomy      at 1 mo old    Social History:   reports that she has quit smoking. She does not have any smokeless tobacco history on file. She reports that she does not drink alcohol. Her drug history is not on file.  Family History  Problem Relation Age of Onset  . Cancer Sister     breast cancer  . Diabetes Sister   . Cancer Brother     colon cancer  . Hyperlipidemia Brother   . Heart disease Mother   .  Alcohol abuse Father   . Heart disease Sister   . Heart attack Sister     Medications: Patient's Medications  New Prescriptions   No medications on file  Previous Medications   ASPIRIN 81 MG EC TABLET    Take 81 mg by mouth daily.     CETIRIZINE (ZYRTEC) 10 MG TABLET    Take 10 mg by mouth daily.   CITALOPRAM (CELEXA) 10 MG TABLET    Take 1 tablet (10 mg total) by mouth daily.   COENZYME Q10 (COQ10) 100 MG CAPS    Take by mouth daily.   CRANBERRY 500 MG CAPS    Take by mouth daily.   DONEPEZIL (ARICEPT) 10 MG TABLET    Take 1 tablet (10 mg total) by mouth daily.   IRBESARTAN (AVAPRO) 150 MG TABLET    Take 1 tablet (150 mg total) by mouth at bedtime.   MULTIPLE VITAMIN (MULTIVITAMIN) TABLET    Take 1 tablet by mouth daily.   OMEGA-3  FATTY ACIDS (FISH OIL) 1200 MG CAPS    Take by mouth. 2 by mouth daily   ROSUVASTATIN (CRESTOR) 40 MG TABLET    Take 1 tablet (40 mg total) by mouth daily.  Modified Medications   Modified Medication Previous Medication   MEMANTINE HCL ER (NAMENDA XR) 28 MG CP24 NAMENDA XR 28 MG CP24      1 by mouth every other day    TAKE ONE CAPSULE BY MOUTH ONCE DAILY WHEN COMPLETE TITRATION PACK  Discontinued Medications   CIPROFLOXACIN (CIPRO) 500 MG TABLET    Take 1 tablet (500 mg total) by mouth 2 (two) times daily.   CRANBERRY 250 MG CAPS    Take by mouth daily.   LORATADINE (ALLERGY) 10 MG TABLET    Take 10 mg by mouth daily.     Physical Exam: Filed Vitals:   03/20/14 0811  BP: 120/78  Pulse: 68  Temp: 97.6 F (36.4 C)  TempSrc: Oral  Resp: 10  Height: 5' 3.5" (1.613 m)  Weight: 163 lb (73.936 kg)  SpO2: 90%  Physical Exam  Constitutional: She appears well-developed and well-nourished. No distress.  Disheveled, has been incontinent of urine  Cardiovascular: Normal rate, regular rhythm, normal heart sounds and intact distal pulses.   Pulmonary/Chest: Effort normal and breath sounds normal. No respiratory distress.  Abdominal: Soft. Bowel sounds are normal. She exhibits no distension and no mass. There is no tenderness.  Musculoskeletal:  Gait unsteady due to foot pain, but won't admit to any problems  Neurological: She is alert.  Oriented to self only  Skin: Skin is warm and dry.  Psychiatric:  Is much happier today than she's been lately    Labs reviewed: Basic Metabolic Panel:  Recent Labs  16/10/96 0944  NA 142  K 4.2  CL 101  CO2 26  GLUCOSE 101*  BUN 18  CREATININE 0.85  CALCIUM 9.9  CBC:  Recent Labs  03/29/13 0944  WBC 7.6  NEUTROABS 3.7  HGB 15.0  HCT 45.3  MCV 88    Assessment/Plan 1. Alzheimer's disease - they are going to take aricept daily, namenda  every other day for now b/c higher doses seem to agitate her -then will try namzaric -  Memantine HCl-Donepezil HCl (NAMZARIC) 14-10 MG CP24; Take 1 capsule by mouth daily.  Dispense: 30 capsule; Refill: 3  2. Essential hypertension, benign -bp at goal with current therapy  3. Severe recurrent depression with psychosis -continues with celexa which has  made her more pleasant  4. Urge incontinence of urine -seems this persists to some degree despite resolution of uti -will give samples of myrbetriq  to try and [name] will call or mychart message if it is effective  5. Hearing loss, bilateral -refuses intervention and would lose hearing aides  6. Foot pain, bilateral -chronic, denies any problem, use tylenol if complains of this  Labs/tests ordered:  Will check fasting labs--cbc, cmp, flp next visit Next appt:  3 mos for annual exam with mmse, labs day of visit

## 2014-03-27 ENCOUNTER — Other Ambulatory Visit: Payer: Self-pay | Admitting: Internal Medicine

## 2014-03-29 ENCOUNTER — Other Ambulatory Visit: Payer: Self-pay | Admitting: Internal Medicine

## 2014-03-31 ENCOUNTER — Encounter: Payer: Self-pay | Admitting: Internal Medicine

## 2014-04-01 ENCOUNTER — Telehealth: Payer: Self-pay | Admitting: *Deleted

## 2014-04-01 ENCOUNTER — Other Ambulatory Visit: Payer: Self-pay | Admitting: *Deleted

## 2014-04-01 MED ORDER — MIRABEGRON ER 50 MG PO TB24: 50.0000 mg | ORAL_TABLET | Freq: Every day | ORAL | Status: DC

## 2014-04-01 NOTE — Telephone Encounter (Signed)
Spoke with patient's daughter regarding the myrbetriq mg change from 25 to 60, she stated that the 50 mg would be fine if Dr. Renato Gails recommended. I also ordered Crestor from [facility].

## 2014-04-04 ENCOUNTER — Encounter: Payer: Self-pay | Admitting: Internal Medicine

## 2014-04-11 ENCOUNTER — Encounter: Payer: Self-pay | Admitting: Internal Medicine

## 2014-04-14 MED ORDER — MIRABEGRON ER 50 MG PO TB24: 50.0000 mg | ORAL_TABLET | Freq: Every day | ORAL | Status: AC

## 2014-04-14 NOTE — Addendum Note (Signed)
Addended by: Maurice Small on: 04/14/2014 09:28 AM   Modules accepted: Orders

## 2014-07-16 ENCOUNTER — Telehealth: Payer: Self-pay | Admitting: *Deleted

## 2014-07-16 NOTE — Telephone Encounter (Signed)
[employer]ed insurance Company [employer] # [phone] Option 4 and spoke with Morrie Sheldonshley and Arlee Muslimamzaric was approved through 07/17/2015. Pharmacy Notified.

## 2014-08-18 ENCOUNTER — Other Ambulatory Visit: Payer: Self-pay | Admitting: *Deleted

## 2014-08-18 ENCOUNTER — Other Ambulatory Visit: Payer: Medicare Other

## 2014-08-18 ENCOUNTER — Encounter: Payer: Self-pay | Admitting: Internal Medicine

## 2014-08-18 DIAGNOSIS — E785 Hyperlipidemia, unspecified: Secondary | ICD-10-CM

## 2014-08-18 DIAGNOSIS — I10 Essential (primary) hypertension: Secondary | ICD-10-CM

## 2014-08-22 ENCOUNTER — Encounter: Payer: Self-pay | Admitting: Internal Medicine

## 2014-08-22 ENCOUNTER — Ambulatory Visit (INDEPENDENT_AMBULATORY_CARE_PROVIDER_SITE_OTHER): Payer: PPO | Admitting: Internal Medicine

## 2014-08-22 VITALS — BP 120/80 | HR 72 | Temp 97.4°F | Resp 18 | Ht 63.5 in | Wt 153.0 lb

## 2014-08-22 DIAGNOSIS — Z23 Encounter for immunization: Secondary | ICD-10-CM

## 2014-08-22 DIAGNOSIS — I10 Essential (primary) hypertension: Secondary | ICD-10-CM

## 2014-08-22 DIAGNOSIS — G309 Alzheimer's disease, unspecified: Secondary | ICD-10-CM

## 2014-08-22 DIAGNOSIS — Z0000 Encounter for general adult medical examination without abnormal findings: Secondary | ICD-10-CM

## 2014-08-22 DIAGNOSIS — H9193 Unspecified hearing loss, bilateral: Secondary | ICD-10-CM | POA: Diagnosis not present

## 2014-08-22 DIAGNOSIS — N3941 Urge incontinence: Secondary | ICD-10-CM | POA: Diagnosis not present

## 2014-08-22 DIAGNOSIS — F333 Major depressive disorder, recurrent, severe with psychotic symptoms: Secondary | ICD-10-CM | POA: Diagnosis not present

## 2014-08-22 DIAGNOSIS — E785 Hyperlipidemia, unspecified: Secondary | ICD-10-CM

## 2014-08-22 DIAGNOSIS — F0280 Dementia in other diseases classified elsewhere without behavioral disturbance: Secondary | ICD-10-CM

## 2014-08-22 NOTE — Progress Notes (Signed)
did'nt pass clock test

## 2014-08-22 NOTE — Patient Instructions (Signed)
Debrox drops at bedtime for ear wax.  Myrbetriq samples given.

## 2014-08-22 NOTE — Progress Notes (Signed)
Patient ID: Rita Jones, female   DOB: 1931/07/15, 81 y.o.   MRN: [id number]   Location:  [facility]  Code Status: DNR  No Known Allergies  Chief Complaint  Patient presents with  . Annual Exam    HPI: Patient is a 81 y.o. white female seen in the office today for her annual exam.  She is accompanied by her daughter.  Alternating namzaric with aricept and then will try namzaric daily.    myrbetriq had been too expensive due to donut hole.  Also was out of crestor.  myrbetriq not approved by insurance in new year.  Her daughter will send me a message about it.  Did get crestor again.   Some days will not change underwear.  May have uti b/c will not bathe properly.  Some days happy and does everything like she's supposed to.    Review of Systems:  Review of Systems  Constitutional: Negative for fever.  HENT: Positive for hearing loss. Negative for congestion.   Eyes: Negative for blurred vision.  Respiratory: Negative for shortness of breath.   Cardiovascular: Negative for chest pain and leg swelling.  Gastrointestinal: Negative for abdominal pain.  Genitourinary: Positive for dysuria, urgency and frequency.       Urinary incontinence  Musculoskeletal: Negative for falls.  Skin: Negative for rash.  Neurological: Negative for dizziness.  Psychiatric/Behavioral: Positive for depression and memory loss. The patient has insomnia.      Past Medical History  Diagnosis Date  . HYPERLIPIDEMIA 10/26/2007  . DEMENTIA 08/27/2009  . HYPERTENSION 09/01/2008  . UTI 05/18/2009  . MENOPAUSAL DISORDER 02/25/2010  . OSTEOPENIA 02/25/2008  . INSOMNIA-SLEEP DISORDER-UNSPEC 10/21/2009  . FATIGUE 10/21/2009  . Memory loss 02/25/2008  . FACIAL FLUSHING 10/21/2009  . Dysuria 04/12/2010  . COLONIC POLYPS, HX OF 02/25/2008  . DIVERTICULOSIS, COLON 09/02/2010    Past Surgical History  Procedure Laterality Date  . Cholecystectomy  1970    s/p  .  Appendectomy  1970  . Abdominal hysterectomy  1985  . S/p bilat bunionectomy  2005  . S/p right mastoidectomy      at 309 mo old    Social History:   reports that she has quit smoking. She does not have any smokeless tobacco history on file. She reports that she does not drink alcohol. Her drug history is not on file.  Family History  Problem Relation Age of Onset  . Cancer Sister     breast cancer  . Diabetes Sister   . Cancer Brother     colon cancer  . Hyperlipidemia Brother   . Heart disease Mother   . Alcohol abuse Father   . Heart disease Sister   . Heart attack Sister     Medications: Patient's Medications  New Prescriptions   No medications on file  Previous Medications   ASPIRIN 81 MG EC TABLET    Take 81 mg by mouth daily.     CETIRIZINE (ZYRTEC) 10 MG TABLET    Take 10 mg by mouth daily.   CITALOPRAM (CELEXA) 10 MG TABLET    TAKE ONE TABLET BY MOUTH ONCE DAILY   COENZYME Q10 (COQ10) 100 MG CAPS    Take by mouth daily.   CRANBERRY 500 MG CAPS    Take by mouth daily.   CRESTOR 40 MG TABLET    TAKE ONE TABLET BY MOUTH ONCE DAILY   IRBESARTAN (AVAPRO) 150 MG TABLET    TAKE  ONE TABLET BY MOUTH AT BEDTIME   MEMANTINE HCL-DONEPEZIL HCL (NAMZARIC) 14-10 MG CP24    Take 1 capsule by mouth daily.   MIRABEGRON ER (MYRBETRIQ) 25 MG TB24 TABLET    Take 1 tablet (25 mg total) by mouth daily. (samples given)   MIRABEGRON ER (MYRBETRIQ) 50 MG TB24 TABLET    Take 1 tablet (50 mg total) by mouth daily.   MULTIPLE VITAMIN (MULTIVITAMIN) TABLET    Take 1 tablet by mouth daily.   OMEGA-3 FATTY ACIDS (FISH OIL) 1200 MG CAPS    Take by mouth. 2 by mouth daily  Modified Medications   No medications on file  Discontinued Medications   CRESTOR 40 MG TABLET    TAKE ONE TABLET BY MOUTH ONCE DAILY   Physical Exam: Filed Vitals:   08/22/14 0903  BP: 120/80  Pulse: 72  Temp: 97.4 F (36.3 C)  TempSrc: Oral  Resp: 18  Height: 5' 3.5" (1.613 m)  Weight: 153 lb (69.4 kg)  SpO2: 97%    Physical Exam  Constitutional: She appears well-developed and well-nourished. No distress.  HENT:  Head: Normocephalic and atraumatic.  Right Ear: External ear normal.  Left Ear: External ear normal.  Nose: Nose normal.  Mouth/Throat: Oropharynx is clear and moist. No oropharyngeal exudate.  Cerumen in bilateral ears  Eyes: Conjunctivae and EOM are normal. Pupils are equal, round, and reactive to light.  Neck: Normal range of motion. Neck supple. No JVD present. No tracheal deviation present. No thyromegaly present.  Cardiovascular: Normal rate, regular rhythm, normal heart sounds and intact distal pulses.   Chronic venous stasis changes and dry skin on legs  Pulmonary/Chest: Effort normal and breath sounds normal. Right breast exhibits no inverted nipple, no mass, no nipple discharge, no skin change and no tenderness. Left breast exhibits no inverted nipple, no mass, no nipple discharge, no skin change and no tenderness.  Abdominal: Soft. Bowel sounds are normal. She exhibits no distension and no mass. There is no tenderness.  Scar on right side of abdomen  Musculoskeletal: She exhibits no edema or tenderness.  Shuffling gait  Neurological: She is alert. She has normal reflexes.  Psychiatric:  Flat affect, but pleasant today;  Always denies all symptoms    Assessment/Plan 1. Hyperlipemia -labs were done today, and is back on crestor but there was a gap in her treatment - CMP - Lipid Panel - CBC with Differential  2. Benign hypertension -cont current regimen - CMP - Lipid Panel - CBC with Differential  3. Alzheimer's disease -cont namzaric--recommend daily but wanted to use up straight aricept pills first -she is nearing snf placement at this point as she continues to progress--has not begun falling yet and still eating well, but self-care is limited especially on bad days  4. Urge incontinence of urine -cont myrbetriq  daily (does not have at present)--given samples  b/c of an insurance coverage issue--await message from her daughter about what was going on there  5. Severe recurrent depression with psychosis -cont celexa for this, no mention of psychosis this visit, but does have some behavioral disturbance at times--refusing to bathe and change underwear--may need to add depakote if she continues with this or it increases in frequency and her daughter wants to keep her at home   6. Hearing loss, bilateral -advised to try debrox at hs to keep cerumen at bay so her senses are intact in the context of her dementia  7.  General medical exam:  She had a  physical today -flu shot was given also -needs prevnar next visit -had pneumovax Had cscope 2010 and no longer a candidate for this with her advanced dementia and advanced age even in context of fh/o cancer (would not tolerate chemo/xrt/surgery at this point) Mammo nl in April 2015 and breast exam was normal today MMSE 10/30 failing clock today Depression screen was previously normal, but she does have depression and denies everything due to her dementia (is fairly controlled with medication) -had tdap 6/14 -no longer needs paps at her age   Next appt:  3 mos  Chasidy Janak L. Esias Mory, D.O. Geriatrics [facility] [address] [place], [place] [address] Cell Phone (Mon-Fri 8am-5pm):  [phone] On Call:  [phone] & follow prompts after 5pm & weekends Office Phone:  [phone] Office Fax:  [phone]

## 2014-08-23 LAB — LIPID PANEL
Chol/HDL Ratio: 2.4 ratio units (ref 0.0–4.4)
Cholesterol, Total: 177 mg/dL (ref 100–199)
HDL: 75 mg/dL (ref >39)
LDL Calculated: 83 mg/dL (ref 0–99)
Triglycerides: 93 mg/dL (ref 0–149)
VLDL Cholesterol Cal: 19 mg/dL (ref 5–40)

## 2014-08-23 LAB — CBC WITH DIFFERENTIAL/PLATELET
Basophils Absolute: 0 10*3/uL (ref 0.0–0.2)
Basos: 0 %
Eos: 2 %
Eosinophils Absolute: 0.2 10*3/uL (ref 0.0–0.4)
HCT: 44.7 % (ref 34.0–46.6)
Hemoglobin: 14.5 g/dL (ref 11.1–15.9)
Immature Grans (Abs): 0 10*3/uL (ref 0.0–0.1)
Immature Granulocytes: 0 %
Lymphocytes Absolute: 3.8 10*3/uL — ABNORMAL HIGH (ref 0.7–3.1)
Lymphs: 38 %
MCH: 27.7 pg (ref 26.6–33.0)
MCHC: 32.4 g/dL (ref 31.5–35.7)
MCV: 86 fL (ref 79–97)
Monocytes Absolute: 1.1 10*3/uL — ABNORMAL HIGH (ref 0.1–0.9)
Monocytes: 11 %
Neutrophils Absolute: 5 10*3/uL (ref 1.4–7.0)
Neutrophils Relative %: 49 %
Platelets: 296 10*3/uL (ref 150–379)
RBC: 5.23 x10E6/uL (ref 3.77–5.28)
RDW: 14.7 % (ref 12.3–15.4)
WBC: 10 10*3/uL (ref 3.4–10.8)

## 2014-08-23 LAB — COMPREHENSIVE METABOLIC PANEL
ALT: 22 IU/L (ref 0–32)
AST: 35 IU/L (ref 0–40)
Albumin/Globulin Ratio: 1.6 (ref 1.1–2.5)
Albumin: 4.3 g/dL (ref 3.5–4.7)
Alkaline Phosphatase: 118 IU/L — ABNORMAL HIGH (ref 39–117)
BUN/Creatinine Ratio: 23 (ref 11–26)
BUN: 23 mg/dL (ref 8–27)
CO2: 24 mmol/L (ref 18–29)
Calcium: 10 mg/dL (ref 8.7–10.3)
Chloride: 102 mmol/L (ref 97–108)
Creatinine, Ser: 1 mg/dL (ref 0.57–1.00)
GFR calc Af Amer: 61 mL/min/{1.73_m2} (ref >59)
GFR calc non Af Amer: 53 mL/min/{1.73_m2} — ABNORMAL LOW (ref >59)
Globulin, Total: 2.7 g/dL (ref 1.5–4.5)
Glucose: 124 mg/dL — ABNORMAL HIGH (ref 65–99)
Potassium: 4.2 mmol/L (ref 3.5–5.2)
Sodium: 141 mmol/L (ref 134–144)
Total Bilirubin: 0.5 mg/dL (ref 0.0–1.2)
Total Protein: 7 g/dL (ref 6.0–8.5)

## 2014-08-24 ENCOUNTER — Encounter: Payer: Self-pay | Admitting: Internal Medicine

## 2014-08-30 ENCOUNTER — Encounter: Payer: Self-pay | Admitting: Internal Medicine

## 2014-09-08 ENCOUNTER — Telehealth: Payer: Self-pay | Admitting: *Deleted

## 2014-09-08 NOTE — Telephone Encounter (Signed)
Called [employer] #: [phone] and spoke with Tammy SoursGreg for Prior Authorization for Myrbetriq 50mg . He will be sending requested to Clinical Team and they will call back to do clinical Review. Member ID: [id number].

## 2014-09-09 NOTE — Telephone Encounter (Signed)
Form sent from [facility] for Prior Authorization for Myrbetriq. Given to Dr. Renato Gailseed to review and sign. Fax back to [phone]

## 2014-09-15 ENCOUNTER — Other Ambulatory Visit: Payer: Self-pay | Admitting: Internal Medicine

## 2014-09-15 NOTE — Telephone Encounter (Signed)
Approved from 09/12/2014 until 07/25/2015. Patient Notified.

## 2014-09-20 ENCOUNTER — Encounter: Payer: Self-pay | Admitting: Internal Medicine

## 2014-09-30 ENCOUNTER — Encounter: Payer: Self-pay | Admitting: Internal Medicine

## 2014-10-01 ENCOUNTER — Encounter: Payer: Self-pay | Admitting: Internal Medicine

## 2014-10-02 ENCOUNTER — Telehealth: Payer: Self-pay | Admitting: *Deleted

## 2014-10-02 ENCOUNTER — Encounter: Payer: Self-pay | Admitting: Internal Medicine

## 2014-10-02 NOTE — Telephone Encounter (Signed)
Left message on machine to inform daughter that Dr. Renato Gailseed should get a copy of her ED note if she is admitted.  I think I need to check Mom into the hospital. This week she won't get out of bed and is not eating much. Sometimes she won't take her meds. We forced the issue on Tue. morning and got her out of bed and cleaned up, but now she won't get up again. So, tomorrow night I may have to call an ambulance and get her to the hospital. I can't do it tonight because of my schedule tomorrow morning, but will do it tomorrow (Thurs) night. Will you be informed if I check her in? I'll try to check for messages at work tomorrow. My work number for the afternoon is [phone]. Rita Jones

## 2014-10-05 ENCOUNTER — Other Ambulatory Visit: Payer: Self-pay | Admitting: Internal Medicine

## 2014-10-05 DIAGNOSIS — R41841 Cognitive communication deficit: Secondary | ICD-10-CM

## 2014-10-05 DIAGNOSIS — R5383 Other fatigue: Secondary | ICD-10-CM

## 2014-10-05 DIAGNOSIS — R4189 Other symptoms and signs involving cognitive functions and awareness: Secondary | ICD-10-CM

## 2014-10-05 NOTE — Progress Notes (Signed)
As per message from [employer], I am putting in orders for some labs if she is able to get her mom into the office.  She is to call us beforehand so we can put her on the lab schedule.

## 2014-10-23 ENCOUNTER — Encounter (HOSPITAL_COMMUNITY): Payer: Self-pay

## 2014-10-23 ENCOUNTER — Emergency Department (HOSPITAL_COMMUNITY): Payer: PPO

## 2014-10-23 ENCOUNTER — Inpatient Hospital Stay (HOSPITAL_COMMUNITY)
Admission: EM | Admit: 2014-10-23 | Discharge: 2014-10-27 | DRG: 070 | Disposition: A | Discharge status: Skilled Nursing Facility | Payer: PPO | Attending: Internal Medicine | Admitting: Internal Medicine

## 2014-10-23 DIAGNOSIS — Z833 Family history of diabetes mellitus: Secondary | ICD-10-CM

## 2014-10-23 DIAGNOSIS — R451 Restlessness and agitation: Secondary | ICD-10-CM

## 2014-10-23 DIAGNOSIS — Z7189 Other specified counseling: Secondary | ICD-10-CM | POA: Insufficient documentation

## 2014-10-23 DIAGNOSIS — R410 Disorientation, unspecified: Secondary | ICD-10-CM | POA: Diagnosis not present

## 2014-10-23 DIAGNOSIS — G9340 Encephalopathy, unspecified: Principal | ICD-10-CM | POA: Diagnosis present

## 2014-10-23 DIAGNOSIS — F0280 Dementia in other diseases classified elsewhere without behavioral disturbance: Secondary | ICD-10-CM | POA: Diagnosis present

## 2014-10-23 DIAGNOSIS — N390 Urinary tract infection, site not specified: Secondary | ICD-10-CM | POA: Diagnosis present

## 2014-10-23 DIAGNOSIS — Z86010 Personal history of colonic polyps: Secondary | ICD-10-CM

## 2014-10-23 DIAGNOSIS — Z87440 Personal history of urinary (tract) infections: Secondary | ICD-10-CM

## 2014-10-23 DIAGNOSIS — G309 Alzheimer's disease, unspecified: Secondary | ICD-10-CM | POA: Diagnosis present

## 2014-10-23 DIAGNOSIS — F329 Major depressive disorder, single episode, unspecified: Secondary | ICD-10-CM | POA: Diagnosis present

## 2014-10-23 DIAGNOSIS — J181 Lobar pneumonia, unspecified organism: Secondary | ICD-10-CM | POA: Insufficient documentation

## 2014-10-23 DIAGNOSIS — Z90710 Acquired absence of both cervix and uterus: Secondary | ICD-10-CM

## 2014-10-23 DIAGNOSIS — M8580 Other specified disorders of bone density and structure, unspecified site: Secondary | ICD-10-CM | POA: Diagnosis present

## 2014-10-23 DIAGNOSIS — Z515 Encounter for palliative care: Secondary | ICD-10-CM | POA: Insufficient documentation

## 2014-10-23 DIAGNOSIS — Z800 Family history of malignant neoplasm of digestive organs: Secondary | ICD-10-CM

## 2014-10-23 DIAGNOSIS — F09 Unspecified mental disorder due to known physiological condition: Secondary | ICD-10-CM | POA: Diagnosis present

## 2014-10-23 DIAGNOSIS — Z79899 Other long term (current) drug therapy: Secondary | ICD-10-CM

## 2014-10-23 DIAGNOSIS — Z803 Family history of malignant neoplasm of breast: Secondary | ICD-10-CM

## 2014-10-23 DIAGNOSIS — Z8249 Family history of ischemic heart disease and other diseases of the circulatory system: Secondary | ICD-10-CM

## 2014-10-23 DIAGNOSIS — B9620 Unspecified Escherichia coli [E. coli] as the cause of diseases classified elsewhere: Secondary | ICD-10-CM | POA: Diagnosis present

## 2014-10-23 DIAGNOSIS — Z66 Do not resuscitate: Secondary | ICD-10-CM

## 2014-10-23 DIAGNOSIS — F0390 Unspecified dementia without behavioral disturbance: Secondary | ICD-10-CM

## 2014-10-23 DIAGNOSIS — Z7982 Long term (current) use of aspirin: Secondary | ICD-10-CM

## 2014-10-23 DIAGNOSIS — I10 Essential (primary) hypertension: Secondary | ICD-10-CM | POA: Diagnosis present

## 2014-10-23 DIAGNOSIS — Z87891 Personal history of nicotine dependence: Secondary | ICD-10-CM

## 2014-10-23 DIAGNOSIS — E785 Hyperlipidemia, unspecified: Secondary | ICD-10-CM | POA: Diagnosis present

## 2014-10-23 LAB — CBC
HCT: 42.1 % (ref 36.0–46.0)
Hemoglobin: 13.7 g/dL (ref 12.0–15.0)
MCH: 28.3 pg (ref 26.0–34.0)
MCHC: 32.5 g/dL (ref 30.0–36.0)
MCV: 87 fL (ref 78.0–100.0)
Platelets: 260 10*3/uL (ref 150–400)
RBC: 4.84 MIL/uL (ref 3.87–5.11)
RDW: 15.4 % (ref 11.5–15.5)
WBC: 9.8 10*3/uL (ref 4.0–10.5)

## 2014-10-23 LAB — COMPREHENSIVE METABOLIC PANEL
ALT: 29 U/L (ref 0–35)
ANION GAP: 10 (ref 5–15)
AST: 44 U/L — AB (ref 0–37)
Albumin: 3.5 g/dL (ref 3.5–5.2)
Alkaline Phosphatase: 95 U/L (ref 39–117)
BILIRUBIN TOTAL: 0.8 mg/dL (ref 0.3–1.2)
BUN: 20 mg/dL (ref 6–23)
CALCIUM: 8.8 mg/dL (ref 8.4–10.5)
CO2: 23 mmol/L (ref 19–32)
CREATININE: 1 mg/dL (ref 0.50–1.10)
Chloride: 104 mmol/L (ref 96–112)
GFR, EST AFRICAN AMERICAN: 60 mL/min — AB (ref >90)
GFR, EST NON AFRICAN AMERICAN: 51 mL/min — AB (ref >90)
Glucose, Bld: 165 mg/dL — ABNORMAL HIGH (ref 70–99)
Potassium: 4.1 mmol/L (ref 3.5–5.1)
SODIUM: 137 mmol/L (ref 135–145)
Total Protein: 6.6 g/dL (ref 6.0–8.3)

## 2014-10-23 LAB — URINALYSIS, ROUTINE W REFLEX MICROSCOPIC
Bilirubin Urine: NEGATIVE
GLUCOSE, UA: NEGATIVE mg/dL
KETONES UR: NEGATIVE mg/dL
Nitrite: POSITIVE — AB
Protein, ur: 100 mg/dL — AB
Specific Gravity, Urine: 1.022 (ref 1.005–1.030)
UROBILINOGEN UA: 1 mg/dL (ref 0.0–1.0)
pH: 6.5 (ref 5.0–8.0)

## 2014-10-23 LAB — URINE MICROSCOPIC-ADD ON

## 2014-10-23 MED ORDER — SODIUM CHLORIDE 0.9 % IV BOLUS (SEPSIS)
500.0000 mL | Freq: Once | INTRAVENOUS | Status: AC
  Administered 2014-10-23: 500 mL via INTRAVENOUS

## 2014-10-23 MED ORDER — DEXTROSE 5 % IV SOLN
1.0000 g | Freq: Once | INTRAVENOUS | Status: AC
  Administered 2014-10-24: 1 g via INTRAVENOUS
  Filled 2014-10-23: qty 10

## 2014-10-23 NOTE — Progress Notes (Signed)
CSW was notified by nurse that [facility] due to daughter daughter reporting that the pt is becoming more agitated at home.  CSW attempted to meet with pt at bedside. However, the pt was lethargic. The pt stared at the ceiling and was not communicative.  CSW spoke with daughter at and pt's neighbor at bedside. Daughter confirms that the [facility] due to becoming increasingly agitated due to her dementia. Daughter stated " I just cant do it anymore."  Daughter states that the pt's disease has been slowly progressing since last summer. She informed CSW that she assist the pt with her ADL's . However, she states that sometimes she is not able to help the pt due to the pt becoming aggressive and hitting her.   Daughter states that the pt has not fallen within the past 6 months. Daughter states that the pt is prone to get UTI's. Also, she says that the pt has a good appetite.   Daughter informed CSW that she has been living with the pt in her home for the past 10 years.   At this time the pt does not present with any medical issues. CSW informed daughter about the guidelines of her insurance and their pay restrictions and daughter states that she is aware. CSW will give pt and family a list of ALF.  Trish MageBrittney Jylan Loeza, LCSWA [phone] ED CSW 10/23/2014 11:15 PM

## 2014-10-23 NOTE — ED Notes (Signed)
Patient returned from XR. 

## 2014-10-23 NOTE — Progress Notes (Signed)
EDCM went to speak to patient and family at bedside.  Patient's neighbor/friend from church at bedside. Patient's daughter no longer at bedside.  Patient lives at home with her daughter.  Family friend at bedside reports the patient has not been allowing them to assist her with bathing and sometimes is not taking her medication.  Patient has a walker at home, but she doesn't use it per family friend.  Family friend reports the patient wants to stay in bed most of the time.  Patient has a good appetite per family friend.  Per family friend, they are aware that patient's insurance does not pay for ALF.  Per family friend, an application for Medicaid has been sent, "We just haven't heard anything back from them yet.  Per family friend, they would like to have physical therapy in the home and someone to give her a sponge bath everyday.  EDCM informed family friend that a home health agency would not come out everyday to give the patient a bath.  EDCM informed family friend about CAPS services through DSS and provided him with a list of private duty nursing lists.  EDCM also provided family friend with [place], explained services.  No further EDCM needs at this time.

## 2014-10-23 NOTE — ED Notes (Addendum)
Patient transported to XR. 

## 2014-10-23 NOTE — ED Notes (Signed)
Pt's family wishes to defer EKG at this time until they speak with the physician.

## 2014-10-23 NOTE — ED Notes (Signed)
Bed: WA04 Expected date:  Expected time:  Means of arrival:  Comments: EMS 81 yo female dementia/not taking care of self/combative

## 2014-10-23 NOTE — ED Notes (Signed)
Patient arrived via EMS from her daughter's home. Daughter reports that patient has become increasingly agitated due to dementia.  She no longer allows family to assist her with bathing, uncooperative with family.

## 2014-10-24 ENCOUNTER — Encounter: Payer: Self-pay | Admitting: Internal Medicine

## 2014-10-24 ENCOUNTER — Encounter (HOSPITAL_COMMUNITY): Payer: Self-pay | Admitting: Internal Medicine

## 2014-10-24 DIAGNOSIS — G9340 Encephalopathy, unspecified: Secondary | ICD-10-CM | POA: Diagnosis not present

## 2014-10-24 DIAGNOSIS — Z8249 Family history of ischemic heart disease and other diseases of the circulatory system: Secondary | ICD-10-CM | POA: Diagnosis not present

## 2014-10-24 DIAGNOSIS — E785 Hyperlipidemia, unspecified: Secondary | ICD-10-CM | POA: Diagnosis present

## 2014-10-24 DIAGNOSIS — F0391 Unspecified dementia with behavioral disturbance: Secondary | ICD-10-CM | POA: Diagnosis not present

## 2014-10-24 DIAGNOSIS — J181 Lobar pneumonia, unspecified organism: Secondary | ICD-10-CM

## 2014-10-24 DIAGNOSIS — I10 Essential (primary) hypertension: Secondary | ICD-10-CM

## 2014-10-24 DIAGNOSIS — F329 Major depressive disorder, single episode, unspecified: Secondary | ICD-10-CM | POA: Diagnosis present

## 2014-10-24 DIAGNOSIS — Z800 Family history of malignant neoplasm of digestive organs: Secondary | ICD-10-CM | POA: Diagnosis not present

## 2014-10-24 DIAGNOSIS — Z515 Encounter for palliative care: Secondary | ICD-10-CM | POA: Diagnosis not present

## 2014-10-24 DIAGNOSIS — Z79899 Other long term (current) drug therapy: Secondary | ICD-10-CM | POA: Diagnosis not present

## 2014-10-24 DIAGNOSIS — Z86010 Personal history of colonic polyps: Secondary | ICD-10-CM | POA: Diagnosis not present

## 2014-10-24 DIAGNOSIS — Z90710 Acquired absence of both cervix and uterus: Secondary | ICD-10-CM | POA: Diagnosis not present

## 2014-10-24 DIAGNOSIS — F09 Unspecified mental disorder due to known physiological condition: Secondary | ICD-10-CM | POA: Diagnosis present

## 2014-10-24 DIAGNOSIS — M8580 Other specified disorders of bone density and structure, unspecified site: Secondary | ICD-10-CM | POA: Diagnosis present

## 2014-10-24 DIAGNOSIS — F0390 Unspecified dementia without behavioral disturbance: Secondary | ICD-10-CM

## 2014-10-24 DIAGNOSIS — G309 Alzheimer's disease, unspecified: Secondary | ICD-10-CM | POA: Diagnosis present

## 2014-10-24 DIAGNOSIS — R410 Disorientation, unspecified: Secondary | ICD-10-CM | POA: Diagnosis not present

## 2014-10-24 DIAGNOSIS — N390 Urinary tract infection, site not specified: Secondary | ICD-10-CM | POA: Diagnosis present

## 2014-10-24 DIAGNOSIS — B9620 Unspecified Escherichia coli [E. coli] as the cause of diseases classified elsewhere: Secondary | ICD-10-CM | POA: Diagnosis present

## 2014-10-24 DIAGNOSIS — Z87891 Personal history of nicotine dependence: Secondary | ICD-10-CM | POA: Diagnosis not present

## 2014-10-24 DIAGNOSIS — F0280 Dementia in other diseases classified elsewhere without behavioral disturbance: Secondary | ICD-10-CM | POA: Diagnosis present

## 2014-10-24 DIAGNOSIS — Z833 Family history of diabetes mellitus: Secondary | ICD-10-CM | POA: Diagnosis not present

## 2014-10-24 DIAGNOSIS — Z87440 Personal history of urinary (tract) infections: Secondary | ICD-10-CM | POA: Diagnosis not present

## 2014-10-24 DIAGNOSIS — Z803 Family history of malignant neoplasm of breast: Secondary | ICD-10-CM | POA: Diagnosis not present

## 2014-10-24 DIAGNOSIS — Z7982 Long term (current) use of aspirin: Secondary | ICD-10-CM | POA: Diagnosis not present

## 2014-10-24 DIAGNOSIS — Z7189 Other specified counseling: Secondary | ICD-10-CM | POA: Diagnosis not present

## 2014-10-24 DIAGNOSIS — Z66 Do not resuscitate: Secondary | ICD-10-CM | POA: Diagnosis not present

## 2014-10-24 MED ORDER — HALOPERIDOL LACTATE 5 MG/ML IJ SOLN
2.0000 mg | Freq: Four times a day (QID) | INTRAMUSCULAR | Status: DC | PRN
  Administered 2014-10-24: 2 mg via INTRAVENOUS
  Filled 2014-10-24: qty 1

## 2014-10-24 MED ORDER — MEMANTINE HCL-DONEPEZIL HCL ER 14-10 MG PO CP24: 1.0000 | ORAL_CAPSULE | Freq: Every day | ORAL | Status: DC

## 2014-10-24 MED ORDER — LORAZEPAM 1 MG PO TABS
1.0000 mg | ORAL_TABLET | Freq: Four times a day (QID) | ORAL | Status: DC | PRN
  Administered 2014-10-24 – 2014-10-27 (×2): 1 mg via ORAL
  Filled 2014-10-24 (×2): qty 1

## 2014-10-24 MED ORDER — ONDANSETRON HCL 4 MG PO TABS: 4.0000 mg | ORAL_TABLET | Freq: Four times a day (QID) | ORAL | Status: DC | PRN

## 2014-10-24 MED ORDER — IRBESARTAN 150 MG PO TABS
150.0000 mg | ORAL_TABLET | Freq: Every day | ORAL | Status: DC
  Administered 2014-10-26: 150 mg via ORAL
  Filled 2014-10-24 (×4): qty 1

## 2014-10-24 MED ORDER — CEFTRIAXONE SODIUM IN DEXTROSE 20 MG/ML IV SOLN
1.0000 g | Freq: Every day | INTRAVENOUS | Status: DC
  Administered 2014-10-24 – 2014-10-26 (×3): 1 g via INTRAVENOUS
  Filled 2014-10-24 (×4): qty 50

## 2014-10-24 MED ORDER — ACETAMINOPHEN 325 MG PO TABS: 650.0000 mg | ORAL_TABLET | Freq: Four times a day (QID) | ORAL | Status: DC | PRN

## 2014-10-24 MED ORDER — ENOXAPARIN SODIUM 40 MG/0.4ML ~~LOC~~ SOLN
40.0000 mg | SUBCUTANEOUS | Status: DC
  Administered 2014-10-24 – 2014-10-27 (×4): 40 mg via SUBCUTANEOUS
  Filled 2014-10-24 (×4): qty 0.4

## 2014-10-24 MED ORDER — CITALOPRAM HYDROBROMIDE 10 MG PO TABS
10.0000 mg | ORAL_TABLET | Freq: Every day | ORAL | Status: DC
  Administered 2014-10-24 – 2014-10-27 (×4): 10 mg via ORAL
  Filled 2014-10-24 (×4): qty 1

## 2014-10-24 MED ORDER — ROSUVASTATIN CALCIUM 40 MG PO TABS
40.0000 mg | ORAL_TABLET | Freq: Every day | ORAL | Status: DC
  Administered 2014-10-25 – 2014-10-26 (×2): 40 mg via ORAL
  Filled 2014-10-24 (×4): qty 1

## 2014-10-24 MED ORDER — DEXTROSE 5 % IV SOLN
500.0000 mg | Freq: Once | INTRAVENOUS | Status: AC
  Administered 2014-10-24: 500 mg via INTRAVENOUS
  Filled 2014-10-24: qty 500

## 2014-10-24 MED ORDER — ONDANSETRON HCL 4 MG/2ML IJ SOLN: 4.0000 mg | Freq: Four times a day (QID) | INTRAMUSCULAR | Status: DC | PRN

## 2014-10-24 MED ORDER — LORATADINE 10 MG PO TABS
10.0000 mg | ORAL_TABLET | Freq: Every day | ORAL | Status: DC
  Administered 2014-10-24 – 2014-10-27 (×4): 10 mg via ORAL
  Filled 2014-10-24 (×4): qty 1

## 2014-10-24 MED ORDER — MEMANTINE HCL ER 14 MG PO CP24
14.0000 mg | ORAL_CAPSULE | Freq: Every day | ORAL | Status: DC
  Administered 2014-10-24 – 2014-10-27 (×4): 14 mg via ORAL
  Filled 2014-10-24 (×4): qty 1

## 2014-10-24 MED ORDER — DEXTROSE 5 % IV SOLN
500.0000 mg | Freq: Every day | INTRAVENOUS | Status: DC
  Administered 2014-10-24 – 2014-10-26 (×3): 500 mg via INTRAVENOUS
  Filled 2014-10-24 (×3): qty 500

## 2014-10-24 MED ORDER — ACETAMINOPHEN 650 MG RE SUPP
650.0000 mg | Freq: Four times a day (QID) | RECTAL | Status: DC | PRN
Start: 2014-10-24 — End: 2014-10-27

## 2014-10-24 MED ORDER — HALOPERIDOL LACTATE 5 MG/ML IJ SOLN: 2.0000 mg | Freq: Four times a day (QID) | INTRAMUSCULAR | Status: DC | PRN

## 2014-10-24 MED ORDER — SODIUM CHLORIDE 0.9 % IV SOLN
INTRAVENOUS | Status: AC
  Administered 2014-10-24: 06:00:00 via INTRAVENOUS

## 2014-10-24 MED ORDER — ASPIRIN EC 81 MG PO TBEC
81.0000 mg | DELAYED_RELEASE_TABLET | Freq: Every day | ORAL | Status: DC
  Administered 2014-10-24 – 2014-10-27 (×4): 81 mg via ORAL
  Filled 2014-10-24 (×4): qty 1

## 2014-10-24 MED ORDER — DONEPEZIL HCL 10 MG PO TABS
10.0000 mg | ORAL_TABLET | Freq: Every day | ORAL | Status: DC
  Administered 2014-10-26: 10 mg via ORAL
  Filled 2014-10-24 (×4): qty 1

## 2014-10-24 MED ORDER — MIRABEGRON ER 50 MG PO TB24
50.0000 mg | ORAL_TABLET | Freq: Every day | ORAL | Status: DC
Start: 2014-10-24 — End: 2014-10-27
  Administered 2014-10-24 – 2014-10-27 (×4): 50 mg via ORAL
  Filled 2014-10-24 (×4): qty 1

## 2014-10-24 MED ORDER — LORAZEPAM 2 MG/ML IJ SOLN: 1.0000 mg | Freq: Four times a day (QID) | INTRAMUSCULAR | Status: DC | PRN

## 2014-10-24 NOTE — Evaluation (Signed)
Physical Therapy Evaluation Patient Details Name: Rita LombardRuby K Davidson-Witt MRN: [id number] DOB: [birth year] Today's Date: 10/24/2014   History of Present Illness  pt admit with increase agitation and confusion at home per dtr report. found to have UTI and pna, and with underlying dementia. Per chart pt has been independent with ambulation and transfers at home and dtr assisting with care.   Clinical Impression  Pt with generalized weakness with mobility to benefit from PT to increase safety and ability with all mobility.      Follow Up Recommendations SNF (depending on pt's progress or family's ability to care for pt 24/7)    Equipment Recommendations   (unclear of what family has or may need)    Recommendations for Other Services       Precautions / Restrictions        Mobility  Bed Mobility Overal bed mobility: Needs Assistance Bed Mobility: Supine to Sit;Sit to Supine     Supine to sit: Min guard Sit to supine: Min guard   General bed mobility comments: cues and gestures to participate. Does not Like for you to pull at her or try to roll her in bed.  Transfers Overall transfer level: Needs assistance Equipment used: 2 person hand held assist Transfers: Sit to/from Stand Sit to Stand: Min assist            Ambulation/Gait Ambulation/Gait assistance: +2 physical assistance Ambulation Distance (Feet): 100 Feet Assistive device: 2 person hand held assist Gait Pattern/deviations: Step-through pattern Gait velocity: moderate   General Gait Details: possibly could have tried one hand held, but needed 2 to direct pateint at this time due to pt thought she was lookign for her car. Had some weaving in her pattern and some reliance on our HHA for baalance at times.   Stairs            Wheelchair Mobility    Modified Rankin (Stroke Patients Only)       Balance Overall balance assessment: Needs assistance Sitting-balance support: Bilateral upper extremity  supported;Feet supported Sitting balance-Leahy Scale: Fair       Standing balance-Leahy Scale: Fair                               Pertinent Vitals/Pain Pain Assessment: Faces Faces Pain Scale: No hurt    Home Living Family/patient expects to be discharged to:: Unsure Living Arrangements: Children (lives with dtr per chart)                    Prior Function Level of Independence: Needs assistance   Gait / Transfers Assistance Needed: per chart pt ambulates with no assitance or AD      Comments: unclear of PLOF for pt unable to report      Hand Dominance        Extremity/Trunk Assessment               Lower Extremity Assessment: Generalized weakness         Communication   Communication: Expressive difficulties (pt responds to some commands or gestures to walk or perform fuctional acitvities, however responses not clear or pertinent to question. )  Cognition Arousal/Alertness: Awake/alert Behavior During Therapy: Restless Overall Cognitive Status: No family/caregiver present to determine baseline cognitive functioning                      General Comments      Exercises  Assessment/Plan    PT Assessment Patient needs continued PT services  PT Diagnosis Difficulty walking;Generalized weakness   PT Problem List Decreased strength;Decreased range of motion;Decreased activity tolerance;Decreased balance;Decreased mobility  PT Treatment Interventions Gait training;Functional mobility training;Therapeutic activities;Therapeutic exercise;Patient/family education   PT Goals (Current goals can be found in the Care Plan section) Acute Rehab PT Goals PT Goal Formulation: Patient unable to participate in goal setting Time For Goal Achievement: 11/07/14 Potential to Achieve Goals: Fair    Frequency Min 3X/week   Barriers to discharge        Co-evaluation               End of Session   Activity Tolerance: Patient  tolerated treatment well Patient left: in bed;with bed alarm set;with call bell/phone within reach Nurse Communication: Mobility status         Time: 4098-1191 PT Time Calculation (min) (ACUTE ONLY): 24 min   Charges:   PT Evaluation $Initial PT Evaluation Tier I: 1 Procedure PT Treatments $Gait Training: 8-22 mins   PT G CodesMarella Bile 10-28-2014, 2:13 PM  Marella Bile, PT Pager: [phone] 2014/10/28

## 2014-10-24 NOTE — Progress Notes (Signed)
Patient ID: Rita Jones, female   DOB: 10/19/1930, 81 y.o.   MRN: [id number] [facility] PROGRESS NOTE  Rita Jones DOB: [birth year] DOA: 10/23/2014 PCP: Bufford Spikes, DO  Brief narrative:    Addendum to admission note done today 10/24/2014. 81 y.o. female with history of advanced dementia, hypertension, hyperlipidemia who presented to [facility] long hospital with generalized weakness. Apparently family has more difficult time taking care of her at home. On admission she was found to have urinary tract infection and chest x-ray significant for possible pneumonia. Patient was started on azithromycin and Rocephin and admitted for further evaluation and management.  Assessment/Plan:    Principal Problem:   Acute encephalopathy / advanced dementia / depression - We'll continue to monitor mental status. Continue supportive care. - No acute intracranial findings seen on CT scan. - Physical therapy once patient able to participate. - Continue citalopram 10 mg daily  Active Problems:   Essential hypertension - Continue current medication, of Avapro 150 mg daily    UTI (lower urinary tract infection) - Antibiotics for pneumonia will cover for urinary tract infection. - Follow up urine culture results    Lobar pneumonia - Continue azithromycin and Rocephin. - Chest x-ray on admission showed left basilar airspace opacity which could reflect mild pneumonia. - Blood cultures not obtained at the time of the admission.    Hyperlipidemia - Resume statin therapy   DVT Prophylaxis  - Lovenox subcutaneous ordered  Code Status: Full.  Family Communication:  Family not at bedside Disposition Plan: Needs physical therapy evaluation  IV access:  Peripheral IV  Procedures and diagnostic studies:    Dg Chest 2 View 10/23/2014  Lungs mildly hypoexpanded. Mild left basilar airspace opacity could reflect mild pneumonia. Would correlate for associated symptoms.  Suspect underlying moderate to large hiatal hernia.   Electronically Signed   By: Roanna Raider M.D.   On: 10/23/2014 23:58   Ct Head Wo Contrast 10/24/2014  1. No acute intracranial pathology seen on CT. 2. Moderate cortical volume loss and scattered small vessel ischemic microangiopathy.   Electronically Signed   By: Roanna Raider M.D.   On: 10/24/2014 01:09    Medical Consultants:  None  Other Consultants:  Physical therapy  IAnti-Infectives:   Azithromycin 10/24/2014 --> Rocephin 10/24/2014 -->   Manson Passey, MD  [facility] Pager [phone]  If 7PM-7AM, please contact night-coverage [email or web address] Password [email or web address] 10/24/2014, 9:40 AM   LOS: 0 days    HPI/Subjective: No acute overnight events.  Objective: Filed Vitals:   10/23/14 2338 10/24/14 0014 10/24/14 0301 10/24/14 0621  BP: 93/82  119/62 95/71  Pulse:   75 85  Temp:  98.2 F (36.8 C) 98 F (36.7 C) 97.8 F (36.6 C)  TempSrc:  Rectal Oral Oral  Resp:   16 16  SpO2:   95% 97%   No intake or output data in the 24 hours ending 10/24/14 0940  Exam:   General:  Pt is not in acute distress  Cardiovascular: Regular rate and rhythm, S1/S2 (+)  Respiratory: no wheezing, no crackles, no rhonchi   Data Reviewed: Basic Metabolic Panel:  Recent Labs Lab 10/23/14 2155  NA 137  K 4.1  CL 104  CO2 23  GLUCOSE 165*  BUN 20  CREATININE 1.00  CALCIUM 8.8   Liver Function Tests:  Recent Labs Lab 10/23/14 2155  AST 44*  ALT 29  ALKPHOS 95  BILITOT 0.8  PROT 6.6  ALBUMIN 3.5   No  results for input(s): LIPASE, AMYLASE in the last 168 hours. No results for input(s): AMMONIA in the last 168 hours. CBC:  Recent Labs Lab 10/23/14 2155  WBC 9.8  HGB 13.7  HCT 42.1  MCV 87.0  PLT 260   Cardiac Enzymes: No results for input(s): CKTOTAL, CKMB, CKMBINDEX, TROPONINI in the last 168 hours. BNP: Invalid input(s): POCBNP CBG: No results for input(s): GLUCAP in the last 168 hours.  No  results found for this or any previous visit (from the past 240 hour(s)).   Scheduled Meds: . aspirin EC  81 mg Oral Daily  . azithromycin  500 mg Intravenous QHS  . cefTRIAXone (ROCEPHIN)  IV  1 g Intravenous QHS  . citalopram  10 mg Oral Daily  . donepezil  10 mg Oral QHS  . enoxaparin (LOVENOX) injection  40 mg Subcutaneous Q24H  . irbesartan  150 mg Oral QHS  . loratadine  10 mg Oral Daily  . memantine  14 mg Oral Daily  . mirabegron ER  50 mg Oral Daily  . rosuvastatin  40 mg Oral q1800   Continuous Infusions: . sodium chloride 75 mL/hr at 10/24/14 [id number]

## 2014-10-24 NOTE — Progress Notes (Signed)
Patient agitated, pulled out iv and id band.  Refusing to stay in bed.  Saying she needs to get to her car.  Dr. Elisabeth Pigeonevine notified with orders received for patients agitation.

## 2014-10-24 NOTE — ED Notes (Signed)
Patient transported to CT 

## 2014-10-24 NOTE — Progress Notes (Signed)
[facility] ask EDP to order home health orders for RN, PT and aide with face to face if patient is discharged this evening.  Thank you.

## 2014-10-24 NOTE — ED Notes (Signed)
Daughter-password-"[email or web address]"-[phone]   Work # [phone]

## 2014-10-24 NOTE — ED Notes (Signed)
Dr. Kakrakandy at bedside. 

## 2014-10-24 NOTE — Progress Notes (Signed)
CSW left voicemail for Pt's daughter, Arline AspCindy, requesting a call back. CSW was attempting to complete psychosocial assessment and discuss family's desire for placement. CSW to continue following Pt while hospitalized.  Chad CordialLauren Carter, LCSWA 10/24/2014 3:53 PM [phone]

## 2014-10-24 NOTE — ED Notes (Signed)
Harrison, MD at bedside. 

## 2014-10-24 NOTE — Progress Notes (Signed)
ANTIBIOTIC CONSULT NOTE - INITIAL  Pharmacy Consult for Ceftriaxone Indication: UTI  No Known Allergies  Patient Measurements:   Adjusted Body Weight:   Vital Signs: Temp: 98 F (36.7 C) (04/01 0301) Temp Source: Oral (04/01 0301) BP: 119/62 mmHg (04/01 0301) Pulse Rate: 75 (04/01 0301) Intake/Output from previous day:   Intake/Output from this shift:    Labs:  Recent Labs  10/23/14 2155  WBC 9.8  HGB 13.7  PLT 260  CREATININE 1.00   CrCl cannot be calculated (Unknown ideal weight.). No results for input(s): VANCOTROUGH, VANCOPEAK, VANCORANDOM, GENTTROUGH, GENTPEAK, GENTRANDOM, TOBRATROUGH, TOBRAPEAK, TOBRARND, AMIKACINPEAK, AMIKACINTROU, AMIKACIN in the last 72 hours.   Microbiology: No results found for this or any previous visit (from the past 720 hour(s)).  Medical History: Past Medical History  Diagnosis Date  . HYPERLIPIDEMIA 10/26/2007  . DEMENTIA 08/27/2009  . HYPERTENSION 09/01/2008  . UTI 05/18/2009  . MENOPAUSAL DISORDER 02/25/2010  . OSTEOPENIA 02/25/2008  . INSOMNIA-SLEEP DISORDER-UNSPEC 10/21/2009  . FATIGUE 10/21/2009  . Memory loss 02/25/2008  . FACIAL FLUSHING 10/21/2009  . Dysuria 04/12/2010  . COLONIC POLYPS, HX OF 02/25/2008  . DIVERTICULOSIS, COLON 09/02/2010    Medications:  Anti-infectives    Start     Dose/Rate Route Frequency Ordered Stop   10/24/14 2200  cefTRIAXone (ROCEPHIN) 1 g in dextrose 5 % 50 mL IVPB - Premix     1 g 100 mL/hr over 30 Minutes Intravenous Daily at bedtime 10/24/14 0256     10/23/14 2345  cefTRIAXone (ROCEPHIN) 1 g in dextrose 5 % 50 mL IVPB     1 g 100 mL/hr over 30 Minutes Intravenous  Once 10/23/14 2343 10/24/14 0052     Assessment: Patient with UTI.  First dose of antibiotics already given in ED.  Goal of Therapy:  Rocephin based on manufacturer dosing recommendations.     Plan:  Ceftriaxone 1gm iv q24hr Follow up culture results  Aleene DavidsonGrimsley Jr, Nephtali Docken Crowford 10/24/2014,3:41 AM

## 2014-10-24 NOTE — ED Notes (Signed)
Dr Romeo AppleHarrison notified patient is unable to cooperate for EKG at this time. OK to d/c.

## 2014-10-24 NOTE — ED Provider Notes (Signed)
CSN: [id number]     Arrival date & time 10/23/14  2043 History   First MD Initiated Contact with Patient 10/23/14 2333     Chief Complaint  Patient presents with  . Dementia     (Consider location/radiation/quality/duration/timing/severity/associated sxs/prior Treatment) Patient is a 81 y.o. female presenting with neurologic complaint. The history is provided by the patient and a friend.  Neurologic Problem This is a chronic problem. The current episode started more than 1 week ago. The problem occurs constantly. The problem has been gradually worsening. Pertinent negatives include no chest pain, no abdominal pain, no headaches and no shortness of breath. Nothing aggravates the symptoms. Nothing relieves the symptoms. She has tried nothing for the symptoms. The treatment provided no relief.    Past Medical History  Diagnosis Date  . HYPERLIPIDEMIA 10/26/2007  . DEMENTIA 08/27/2009  . HYPERTENSION 09/01/2008  . UTI 05/18/2009  . MENOPAUSAL DISORDER 02/25/2010  . OSTEOPENIA 02/25/2008  . INSOMNIA-SLEEP DISORDER-UNSPEC 10/21/2009  . FATIGUE 10/21/2009  . Memory loss 02/25/2008  . FACIAL FLUSHING 10/21/2009  . Dysuria 04/12/2010  . COLONIC POLYPS, HX OF 02/25/2008  . DIVERTICULOSIS, COLON 09/02/2010   Past Surgical History  Procedure Laterality Date  . Cholecystectomy  1970    s/p  . Appendectomy  1970  . Abdominal hysterectomy  1985  . S/p bilat bunionectomy  2005  . S/p right mastoidectomy      at 16 mo old   Family History  Problem Relation Age of Onset  . Cancer Sister     breast cancer  . Diabetes Sister   . Cancer Brother     colon cancer  . Hyperlipidemia Brother   . Heart disease Mother   . Alcohol abuse Father   . Heart disease Sister   . Heart attack Sister    History  Substance Use Topics  . Smoking status: Former Games developer  . Smokeless tobacco: Not on file     Comment: quit tobacco in January of 2000. She was a light smoker for 30 years.  . Alcohol Use: No   OB History     No data available     Review of Systems  Constitutional: Negative for fever and fatigue.  HENT: Negative for congestion and drooling.   Eyes: Negative for pain.  Respiratory: Negative for cough and shortness of breath.   Cardiovascular: Negative for chest pain.  Gastrointestinal: Negative for nausea, vomiting, abdominal pain and diarrhea.  Genitourinary: Negative for dysuria and hematuria.  Musculoskeletal: Negative for back pain, gait problem and neck pain.  Skin: Negative for color change.  Neurological: Negative for dizziness and headaches.  Hematological: Negative for adenopathy.  Psychiatric/Behavioral: Negative for behavioral problems.  All other systems reviewed and are negative.     Allergies  Review of patient's allergies indicates no known allergies.  Home Medications   Prior to Admission medications   Medication Sig Start Date End Date Taking? Authorizing Provider  aspirin 81 MG EC tablet Take 81 mg by mouth daily.      Historical Provider, MD  cetirizine (ZYRTEC) 10 MG tablet Take 10 mg by mouth daily.    Historical Provider, MD  citalopram (CELEXA) 10 MG tablet TAKE ONE TABLET BY MOUTH ONCE DAILY 09/15/14   Tiffany L Reed, DO  Coenzyme Q10 (COQ10) 100 MG CAPS Take by mouth daily.    Historical Provider, MD  Cranberry 500 MG CAPS Take by mouth daily.    Historical Provider, MD  CRESTOR 40 MG tablet TAKE ONE  TABLET BY MOUTH ONCE DAILY 03/27/14   Tiffany L Reed, DO  irbesartan (AVAPRO) 150 MG tablet TAKE ONE TABLET BY MOUTH AT BEDTIME 09/15/14   Tiffany L Reed, DO  Memantine HCl-Donepezil HCl (NAMZARIC) 14-10 MG CP24 Take 1 capsule by mouth daily. 03/20/14   Tiffany L Reed, DO  mirabegron ER (MYRBETRIQ) 50 MG TB24 tablet Take 1 tablet (50 mg total) by mouth daily. 04/14/14   Tiffany L Reed, DO  Multiple Vitamin (MULTIVITAMIN) tablet Take 1 tablet by mouth daily.    Historical Provider, MD  Omega-3 Fatty Acids (FISH OIL) 1200 MG CAPS Take by mouth. 2 by mouth daily     Historical Provider, MD   BP 93/82 mmHg  Pulse 75  Temp(Src) 98.4 F (36.9 C) (Oral)  Resp 17  SpO2 97% Physical Exam  Constitutional: She appears well-developed and well-nourished.  HENT:  Head: Normocephalic.  Mouth/Throat: Oropharynx is clear and moist. No oropharyngeal exudate.  Eyes: Conjunctivae and EOM are normal. Pupils are equal, round, and reactive to light.  Neck: Normal range of motion. Neck supple.  Cardiovascular: Normal rate, regular rhythm, normal heart sounds and intact distal pulses.  Exam reveals no gallop and no friction rub.   No murmur heard. Pulmonary/Chest: Effort normal and breath sounds normal. No respiratory distress. She has no wheezes.  Abdominal: Soft. Bowel sounds are normal. There is no tenderness. There is no rebound and no guarding.  Musculoskeletal: Normal range of motion. She exhibits no edema or tenderness.  Neurological: She is alert.  A/o x 1. Moving all extremities without obvious focal deficit. Will follow some basic commands.  Skin: Skin is warm and dry.  Psychiatric: She has a normal mood and affect. Her behavior is normal.  Nursing note and vitals reviewed.   ED Course  Procedures (including critical care time) Labs Review Labs Reviewed  COMPREHENSIVE METABOLIC PANEL - Abnormal; Notable for the following:    Glucose, Bld 165 (*)    AST 44 (*)    GFR calc non Af Amer 51 (*)    GFR calc Af Amer 60 (*)    All other components within normal limits  URINALYSIS, ROUTINE W REFLEX MICROSCOPIC - Abnormal; Notable for the following:    APPearance TURBID (*)    Hgb urine dipstick MODERATE (*)    Protein, ur 100 (*)    Nitrite POSITIVE (*)    Leukocytes, UA LARGE (*)    All other components within normal limits  URINE MICROSCOPIC-ADD ON - Abnormal; Notable for the following:    Squamous Epithelial / LPF FEW (*)    Bacteria, UA MANY (*)    All other components within normal limits  URINE CULTURE  CBC    Imaging Review Dg Chest 2  View  10/23/2014   CLINICAL DATA:  Worsening agitation.  Initial encounter.  EXAM: CHEST  2 VIEW  COMPARISON:  None.  FINDINGS: The lungs are mildly hypoexpanded. Mild left basilar airspace opacity could reflect mild pneumonia. Would correlate for associated symptoms. There is no evidence of pleural effusion or pneumothorax.  The heart is normal in size; the mediastinal contour is within normal limits. No acute osseous abnormalities are seen. An underlying moderate to large hiatal hernia is suspected.  IMPRESSION: Lungs mildly hypoexpanded. Mild left basilar airspace opacity could reflect mild pneumonia. Would correlate for associated symptoms. Suspect underlying moderate to large hiatal hernia.   Electronically Signed   By: Roanna Raider M.D.   On: 10/23/2014 23:58  EKG Interpretation None      MDM   Final diagnoses:  UTI (lower urinary tract infection)  Confusion  Agitation    12:10 AM 81 y.o. female w hx o fdementia, UTI's who presents with worsening dementia. She was brought in by the daughter but the daughter had to leave and will be back shortly. History is currently from the Neighbor who is familiar with the patient. He reports that the patient has had a progressive cognitive decline and has become more aggressive and unwilling to cooperate with daily tasks such as getting dressed. The patient is afebrile and mildly hypertensive on my initial exam. She is alert and oriented 1. She has no complaints. She is a very poor historian. She is found to have a UTI and I have the neighbor's consent to go ahead and treat her with an antibiotic.  Will admit to hospitalist.     Purvis SheffieldForrest Mikeisha Lemonds, MD 10/25/14 1053

## 2014-10-24 NOTE — H&P (Signed)
[facility] History and Physical  Rita Jones:[id number] DOB: [birth year] DOA: 10/23/2014  Referring physician: ER physician. PCP: Bufford Spikes, DO   Chief Complaint: Agitation.  Most of the history was obtained from ER physician.  HPI: Rita Jones is a 81 y.o. female with history of advanced dementia, hypertension, hyperlipidemia was brought to the ER because of increasing agitation and difficult to control at home by patient's daughter. In the ER patient was found to have urine analysis is consistent with UTI and chest x-ray showing possible pneumonia. At this time patient's daughter was feeling that it was difficult to manage patient at home and may need placement. Patient on exam has advanced dementia and is not very communicative. Patient has been ambulatory*antibiotics and admitted for further management and patient may need SNF placement.   Review of Systems: As presented in the history of presenting illness, rest negative.  Past Medical History  Diagnosis Date  . HYPERLIPIDEMIA 10/26/2007  . DEMENTIA 08/27/2009  . HYPERTENSION 09/01/2008  . UTI 05/18/2009  . MENOPAUSAL DISORDER 02/25/2010  . OSTEOPENIA 02/25/2008  . INSOMNIA-SLEEP DISORDER-UNSPEC 10/21/2009  . FATIGUE 10/21/2009  . Memory loss 02/25/2008  . FACIAL FLUSHING 10/21/2009  . Dysuria 04/12/2010  . COLONIC POLYPS, HX OF 02/25/2008  . DIVERTICULOSIS, COLON 09/02/2010   Past Surgical History  Procedure Laterality Date  . Cholecystectomy  1970    s/p  . Appendectomy  1970  . Abdominal hysterectomy  1985  . S/p bilat bunionectomy  2005  . S/p right mastoidectomy      at 14 mo old   Social History:  reports that she has quit smoking. She does not have any smokeless tobacco history on file. She reports that she does not drink alcohol. Her drug history is not on file. Where does patient live home. Can patient participate in ADLs? No.  No Known Allergies  Family History:  Family History  Problem  Relation Age of Onset  . Cancer Sister     breast cancer  . Diabetes Sister   . Cancer Brother     colon cancer  . Hyperlipidemia Brother   . Heart disease Mother   . Alcohol abuse Father   . Heart disease Sister   . Heart attack Sister       Prior to Admission medications   Medication Sig Start Date End Date Taking? Authorizing Provider  aspirin 81 MG EC tablet Take 81 mg by mouth daily.      Historical Provider, MD  cetirizine (ZYRTEC) 10 MG tablet Take 10 mg by mouth daily.    Historical Provider, MD  citalopram (CELEXA) 10 MG tablet TAKE ONE TABLET BY MOUTH ONCE DAILY 09/15/14   Tiffany L Reed, DO  Coenzyme Q10 (COQ10) 100 MG CAPS Take by mouth daily.    Historical Provider, MD  Cranberry 500 MG CAPS Take by mouth daily.    Historical Provider, MD  CRESTOR 40 MG tablet TAKE ONE TABLET BY MOUTH ONCE DAILY 03/27/14   Tiffany L Reed, DO  irbesartan (AVAPRO) 150 MG tablet TAKE ONE TABLET BY MOUTH AT BEDTIME 09/15/14   Tiffany L Reed, DO  Memantine HCl-Donepezil HCl (NAMZARIC) 14-10 MG CP24 Take 1 capsule by mouth daily. 03/20/14   Tiffany L Reed, DO  mirabegron ER (MYRBETRIQ) 50 MG TB24 tablet Take 1 tablet (50 mg total) by mouth daily. 04/14/14   Tiffany L Reed, DO  Multiple Vitamin (MULTIVITAMIN) tablet Take 1 tablet by mouth daily.    Historical Provider, MD  Omega-3 Fatty Acids (FISH OIL) 1200 MG CAPS Take by mouth. 2 by mouth daily    Historical Provider, MD    Physical Exam: Filed Vitals:   10/23/14 2046 10/23/14 2336 10/23/14 2338 10/24/14 0014  BP: 107/55 89/54 93/82    Pulse: 91 75    Temp: 98.4 F (36.9 C)   98.2 F (36.8 C)  TempSrc: Oral   Rectal  Resp: 16 17    SpO2: 95% 97%       General:  Well-developed and nourished.  Eyes: Anicteric no pallor.  ENT: No discharge from the ears eyes nose and mouth.  Neck: No mass felt.  Cardiovascular: S1 and S2 heard.  Respiratory: No rhonchi or crepitations.  Abdomen: Soft nontender bowel sounds present.  Skin: No  rash.  Musculoskeletal: No edema.  Psychiatric: Patient is demented.  Neurologic: Patient is demented and does not follow commands.  Labs on Admission:  Basic Metabolic Panel:  Recent Labs Lab 10/23/14 2155  NA 137  K 4.1  CL 104  CO2 23  GLUCOSE 165*  BUN 20  CREATININE 1.00  CALCIUM 8.8   Liver Function Tests:  Recent Labs Lab 10/23/14 2155  AST 44*  ALT 29  ALKPHOS 95  BILITOT 0.8  PROT 6.6  ALBUMIN 3.5   No results for input(s): LIPASE, AMYLASE in the last 168 hours. No results for input(s): AMMONIA in the last 168 hours. CBC:  Recent Labs Lab 10/23/14 2155  WBC 9.8  HGB 13.7  HCT 42.1  MCV 87.0  PLT 260   Cardiac Enzymes: No results for input(s): CKTOTAL, CKMB, CKMBINDEX, TROPONINI in the last 168 hours.  BNP (last 3 results) No results for input(s): BNP in the last 8760 hours.  ProBNP (last 3 results) No results for input(s): PROBNP in the last 8760 hours.  CBG: No results for input(s): GLUCAP in the last 168 hours.  Radiological Exams on Admission: Dg Chest 2 View  10/23/2014   CLINICAL DATA:  Worsening agitation.  Initial encounter.  EXAM: CHEST  2 VIEW  COMPARISON:  None.  FINDINGS: The lungs are mildly hypoexpanded. Mild left basilar airspace opacity could reflect mild pneumonia. Would correlate for associated symptoms. There is no evidence of pleural effusion or pneumothorax.  The heart is normal in size; the mediastinal contour is within normal limits. No acute osseous abnormalities are seen. An underlying moderate to large hiatal hernia is suspected.  IMPRESSION: Lungs mildly hypoexpanded. Mild left basilar airspace opacity could reflect mild pneumonia. Would correlate for associated symptoms. Suspect underlying moderate to large hiatal hernia.   Electronically Signed   By: Roanna RaiderJeffery  Chang M.D.   On: 10/23/2014 23:58   Ct Head Wo Contrast  10/24/2014   CLINICAL DATA:  Acute onset of increased agitation. Initial encounter.  EXAM: CT HEAD  WITHOUT CONTRAST  TECHNIQUE: Contiguous axial images were obtained from the base of the skull through the vertex without intravenous contrast.  COMPARISON:  CT of the head performed 04/02/2013  FINDINGS: There is no evidence of acute infarction, mass lesion, or intra- or extra-axial hemorrhage on CT.  Prominence of the ventricles and sulci reflects moderate cortical volume loss. Cerebellar atrophy is noted. Scattered periventricular and subcortical white matter change likely reflects small vessel ischemic microangiopathy.  The brainstem and fourth ventricle are within normal limits. The basal ganglia are unremarkable in appearance. The cerebral hemispheres demonstrate grossly normal gray-white differentiation. No mass effect or midline shift is seen.  There is no evidence of fracture; visualized osseous  structures are unremarkable in appearance. The orbits are within normal limits. The paranasal sinuses and mastoid air cells are well-aerated. No significant soft tissue abnormalities are seen.  IMPRESSION: 1. No acute intracranial pathology seen on CT. 2. Moderate cortical volume loss and scattered small vessel ischemic microangiopathy.   Electronically Signed   By: Roanna Raider M.D.   On: 10/24/2014 01:09     Assessment/Plan Principal Problem:   Acute encephalopathy Active Problems:   Essential hypertension   UTI (lower urinary tract infection)   Dementia   Hyperlipidemia   1. Acute encephalopathy in a patient with dementia - decompensation of patient's known dementia probably secondary to UTI and possible pneumonia. Patient has been in pretty started on antibiotics. Check urine cultures and influenza PCR. Check urine Legionella and strep antigen. Get physical therapy consult and social work consult for possible placement. 2. Dementia - continue present medications. Patient is on my exam not agitated. Closely observe. 3. Hypertension - initial blood pressure was in the low normals. We will gently  hydrate and if blood pressure remains low normals may have to hold antihypertensives. 4. Hyperlipidemia - continue present medications.   DVT Prophylaxis Lovenox.  Code Status: Full code.  Family Communication: None.  Disposition Plan: Admit to inpatient.    Fawzi Melman N. [facility] Pager [phone].  If 7PM-7AM, please contact night-coverage [email or web address] Password [email or web address] 10/24/2014, 2:53 AM

## 2014-10-24 NOTE — Consult Note (Signed)
Patient Rita:XWRU CASIDEE Jones      DOB: 1931/04/04      EAV:[id number]     Consult Note from the Palliative Medicine Team at [facility]    Consult Requested by: Dr Rita Jones     PCP: Rita Spikes, DO Reason for Consultation: GOC      Phone [phone]  Assessment/Recommendations: 81 yo female with alzheimer's dementia, HTN, recurrent UTI's admitted with acute encephalopathy in setting of UTI and ? PNA.   1.  Code Status: listed as Full Code  2. GOC:  Patient unable to participate in any meaningful conversation around goals.  I attempted to reach daughter but have not heard from her yet but did leave message.  If she does not reach me today, I will fu tomorrow.  Appears patient was living at home and still ambulatory. Eating well, but per chart appears to have lost around 10lbs over past several months (albumin 3.5 though).  Does not appear to have frequent hosptializations.  SW/CM looking at SNF options after daughter stated difficulty in caring for her at home.   3. Symptom Management:   1. Acute Encephalopathy- On haldol and ativan PRN.  I would consider stopping or minimizing haldol use as this can certainly worsen or prolong delirium.    Brief HPI: 81 yo female with PMHx of alzheimers dementia, HTN, recurrent UTI's who was admitted to [facility] from home with increasing confusion and agitation at home. Reportedly living at home with daughter and she has been stuggling to meet her care needs at home.  On admission, UA concerning for UTI and ? Of PNA on CXR.  She is currently admitted and being treated with abx and medications to control agitation issues. No family at bedside today and I am unable to obtain any meaningful history from Rita Jones.  She is not able to tell me why she is in hospital and other than a few yes/no answers does not respond appropriately in conversation. Denies any pain, dyspnea, n/v but doubt ROS with any reliability given degree of encephalopathy she has.      PMH:  Past Medical History  Diagnosis Date  . HYPERLIPIDEMIA 10/26/2007  . DEMENTIA 08/27/2009  . HYPERTENSION 09/01/2008  . UTI 05/18/2009  . MENOPAUSAL DISORDER 02/25/2010  . OSTEOPENIA 02/25/2008  . INSOMNIA-SLEEP DISORDER-UNSPEC 10/21/2009  . FATIGUE 10/21/2009  . Memory loss 02/25/2008  . FACIAL FLUSHING 10/21/2009  . Dysuria 04/12/2010  . COLONIC POLYPS, HX OF 02/25/2008  . DIVERTICULOSIS, COLON 09/02/2010     PSH: Past Surgical History  Procedure Laterality Date  . Cholecystectomy  1970    s/p  . Appendectomy  1970  . Abdominal hysterectomy  1985  . S/p bilat bunionectomy  2005  . S/p right mastoidectomy      at 57 mo old   I have reviewed the FH and SH and  If appropriate update it with new information. No Known Allergies Scheduled Meds: . aspirin EC  81 mg Oral Daily  . azithromycin  500 mg Intravenous QHS  . cefTRIAXone (ROCEPHIN)  IV  1 g Intravenous QHS  . citalopram  10 mg Oral Daily  . donepezil  10 mg Oral QHS  . enoxaparin (LOVENOX) injection  40 mg Subcutaneous Q24H  . irbesartan  150 mg Oral QHS  . loratadine  10 mg Oral Daily  . memantine  14 mg Oral Daily  . mirabegron ER  50 mg Oral Daily  . rosuvastatin  40 mg Oral q1800   Continuous Infusions: .  sodium chloride 75 mL/hr at 10/24/14 0619   PRN Meds:.acetaminophen **OR** acetaminophen, haloperidol lactate, LORazepam, LORazepam, ondansetron **OR** ondansetron (ZOFRAN) IV    BP 95/71 mmHg  Pulse 85  Temp(Src) 97.8 F (36.6 C) (Oral)  Resp 16  SpO2 97%   PPS: ?60   Intake/Output Summary (Last 24 hours) at 10/24/14 1304 Last data filed at 10/24/14 1221  Gross per 24 hour  Intake    360 ml  Output      0 ml  Net    360 ml    Physical Exam:  General: Alert, NAD HEENT:  [place], sclera anicteric Neck: supple Chest:   CTAB CVS: RRR Abdomen:soft, ND Ext: no edema Neuro: very confused speech and frequent inappropriate response to questions  Labs: CBC    Component Value Date/Time   WBC 9.8  10/23/2014 2155   WBC 10.0 08/22/2014 0856   RBC 4.84 10/23/2014 2155   RBC 5.23 08/22/2014 0856   HGB 13.7 10/23/2014 2155   HCT 42.1 10/23/2014 2155   PLT 260 10/23/2014 2155   MCV 87.0 10/23/2014 2155   MCH 28.3 10/23/2014 2155   MCH 27.7 08/22/2014 0856   MCHC 32.5 10/23/2014 2155   MCHC 32.4 08/22/2014 0856   RDW 15.4 10/23/2014 2155   RDW 14.7 08/22/2014 0856   LYMPHSABS 3.8* 08/22/2014 0856   LYMPHSABS 2.8 12/26/2012 0921   MONOABS 1.0 12/26/2012 0921   EOSABS 0.2 08/22/2014 0856   EOSABS 0.2 12/26/2012 0921   BASOSABS 0.0 08/22/2014 0856   BASOSABS 0.1 12/26/2012 0921    BMET    Component Value Date/Time   NA 137 10/23/2014 2155   NA 141 08/22/2014 0856   K 4.1 10/23/2014 2155   CL 104 10/23/2014 2155   CO2 23 10/23/2014 2155   GLUCOSE 165* 10/23/2014 2155   GLUCOSE 124* 08/22/2014 0856   BUN 20 10/23/2014 2155   BUN 23 08/22/2014 0856   CREATININE 1.00 10/23/2014 2155   CALCIUM 8.8 10/23/2014 2155   GFRNONAA 51* 10/23/2014 2155   GFRAA 60* 10/23/2014 2155    CMP     Component Value Date/Time   NA 137 10/23/2014 2155   NA 141 08/22/2014 0856   K 4.1 10/23/2014 2155   CL 104 10/23/2014 2155   CO2 23 10/23/2014 2155   GLUCOSE 165* 10/23/2014 2155   GLUCOSE 124* 08/22/2014 0856   BUN 20 10/23/2014 2155   BUN 23 08/22/2014 0856   CREATININE 1.00 10/23/2014 2155   CALCIUM 8.8 10/23/2014 2155   PROT 6.6 10/23/2014 2155   PROT 7.0 08/22/2014 0856   ALBUMIN 3.5 10/23/2014 2155   AST 44* 10/23/2014 2155   ALT 29 10/23/2014 2155   ALKPHOS 95 10/23/2014 2155   BILITOT 0.8 10/23/2014 2155   GFRNONAA 51* 10/23/2014 2155   GFRAA 60* 10/23/2014 2155   4/1 CT Head IMPRESSION: 1. No acute intracranial pathology seen on CT. 2. Moderate cortical volume loss and scattered small vessel ischemic microangiopathy.   3/31 CXR IMPRESSION: Lungs mildly hypoexpanded. Mild left basilar airspace opacity could reflect mild pneumonia. Would correlate for associated  symptoms. Suspect underlying moderate to large hiatal hernia.   Rita Jones D.O. Palliative Medicine Team at [facility]  Pager: [phone] Team Phone: [phone]            \

## 2014-10-25 DIAGNOSIS — Z7189 Other specified counseling: Secondary | ICD-10-CM

## 2014-10-25 DIAGNOSIS — F0390 Unspecified dementia without behavioral disturbance: Secondary | ICD-10-CM

## 2014-10-25 LAB — INFLUENZA PANEL BY PCR (TYPE A & B)
H1N1 flu by pcr: NOT DETECTED
INFLAPCR: NEGATIVE
Influenza B By PCR: NEGATIVE

## 2014-10-25 NOTE — Progress Notes (Signed)
This shift pt refused all PO medications, after several attempts.

## 2014-10-25 NOTE — Progress Notes (Signed)
Patient ID: Rita Jones, female   DOB: 10/08/1930, 81 y.o.   MRN: [id number] [facility] PROGRESS NOTE  Rita Jones [id number] DOB: Aug 11, 1930 DOA: 10/23/2014 PCP: Bufford Spikes, DO  Brief narrative:    81 y.o. female with history of advanced dementia, hypertension, hyperlipidemia who presented to [facility] with generalized weakness. Apparently family has more difficult time taking care of her at home. On admission she was found to have urinary tract infection and chest x-ray significant for possible pneumonia. Patient was started on azithromycin and Rocephin and admitted for further evaluation and management.  Assessment/Plan:    Principal Problem:   Acute encephalopathy / advanced dementia / depression - No significant changes in mental status - Refusing blood work - CT head on admission with no acute intracranial findings - PT eval if able to get the patient to participate  - Continue citalopram 10 mg daily - Continue aricept and namenda   Active Problems:   Essential hypertension - Continue Avapro 150 mg daily    UTI (lower urinary tract infection) - Continue current abx - Urine culture is pending     Lobar pneumonia - Continue azithromycin and Rocephin. - Chest x-ray on admission showed left basilar airspace opacity which could reflect mild pneumonia. - Blood cultures not obtained at the time of the admission.    Hyperlipidemia - Continue statin therapy   DVT Prophylaxis  - Lovenox subcutaneous ordered   Code Status: Full.  Family Communication:  Family not at bedside Disposition Plan: to SNF likely Monday   IV access:  Peripheral IV  Procedures and diagnostic studies:    Dg Chest 2 View 10/23/2014  Lungs mildly hypoexpanded. Mild left basilar airspace opacity could reflect mild pneumonia. Would correlate for associated symptoms. Suspect underlying moderate to large hiatal hernia.   Electronically Signed   By: Roanna Raider  M.D.   On: 10/23/2014 23:58   Ct Head Wo Contrast 10/24/2014  1. No acute intracranial pathology seen on CT. 2. Moderate cortical volume loss and scattered small vessel ischemic microangiopathy.   Electronically Signed   By: Roanna Raider M.D.   On: 10/24/2014 01:09    Medical Consultants:  Palliative care   Other Consultants:  Physical therapy  IAnti-Infectives:   Azithromycin 10/24/2014 --> Rocephin 10/24/2014 -->   Manson Passey, MD  [facility] Pager [phone]  If 7PM-7AM, please contact night-coverage [email or web address] Password [facility] 10/25/2014, 10:32 AM   LOS: 1 day    HPI/Subjective: No acute overnight events.  Objective: Filed Vitals:   10/24/14 0621 10/24/14 1443 10/24/14 2103 10/25/14 0546  BP: 95/71 122/66 141/65 99/70  Pulse: 85 96 107 109  Temp: 97.8 F (36.6 C) 98.2 F (36.8 C) 99.3 F (37.4 C) 98.4 F (36.9 C)  TempSrc: Oral Oral Axillary Axillary  Resp: SpO2: 97% 96% 99% 99%    Intake/Output Summary (Last 24 hours) at 10/25/14 1032 Last data filed at 10/25/14 0935  Gross per 24 hour  Intake 1652.5 ml  Output      0 ml  Net 1652.5 ml    Exam:   General:  Pt is awake, no distress  Cardiovascular: RRR, S1/S2 appreciated   Respiratory: bilateral air entry, no wheezing   Abdomen: non tender, non distended, (+) BS  Extremities: no edema, pulses palpable    Data Reviewed: Basic Metabolic Panel:  Recent Labs Lab 10/23/14 2155  NA 137  K 4.1  CL 104  CO2 23  GLUCOSE  165*  BUN 20  CREATININE 1.00  CALCIUM 8.8   Liver Function Tests:  Recent Labs Lab 10/23/14 2155  AST 44*  ALT 29  ALKPHOS 95  BILITOT 0.8  PROT 6.6  ALBUMIN 3.5   No results for input(s): LIPASE, AMYLASE in the last 168 hours. No results for input(s): AMMONIA in the last 168 hours. CBC:  Recent Labs Lab 10/23/14 2155  WBC 9.8  HGB 13.7  HCT 42.1  MCV 87.0  PLT 260   Cardiac Enzymes: No results for input(s): CKTOTAL, CKMB,  CKMBINDEX, TROPONINI in the last 168 hours. BNP: Invalid input(s): POCBNP CBG: No results for input(s): GLUCAP in the last 168 hours.  No results found for this or any previous visit (from the past 240 hour(s)).   Scheduled Meds: . aspirin EC  81 mg Oral Daily  . azithromycin  500 mg Intravenous QHS  . cefTRIAXone (ROCEPHIN)  IV  1 g Intravenous QHS  . citalopram  10 mg Oral Daily  . donepezil  10 mg Oral QHS  . enoxaparin (LOVENOX) injection  40 mg Subcutaneous Q24H  . irbesartan  150 mg Oral QHS  . loratadine  10 mg Oral Daily  . memantine  14 mg Oral Daily  . mirabegron ER  50 mg Oral Daily  . rosuvastatin  40 mg Oral q1800   Continuous Infusions:

## 2014-10-25 NOTE — Clinical Social Work Placement (Addendum)
Clinical Social Work Department CLINICAL SOCIAL WORK PLACEMENT NOTE 10/25/2014  Patient:  Rita LombardDAVIDSON-WITT,Takeela K  Account Number:  [id number] Admit date:  10/23/2014  Clinical Social Worker:  Elray Bubaegina Kujawa, CLINICAL SOCIAL WORKER  Date/time:  10/25/2014 02:52 PM  Clinical Social Work is seeking post-discharge placement for this patient at the following level of care:   SKILLED NURSING   (*CSW will update this form in Epic as items are completed)   10/25/2014  Patient/family provided with [facility] Department of Clinical Social Work's list of facilities offering this level of care within the geographic area requested by the patient (or if unable, by the patient's family).  10/25/2014  Patient/family informed of their freedom to choose among providers that offer the needed level of care, that participate in Medicare, Medicaid or managed care program needed by the patient, have an available bed and are willing to accept the patient.  10/25/2014  Patient/family informed of [facility]' ownership interest in [facility], as well as of the fact that they are under no obligation to receive care at this facility.  PASARR submitted to EDS on 10/25/2014 PASARR number received on 10/25/2014  FL2 transmitted to all facilities in geographic area requested by pt/family on  10/25/14 FL2 transmitted to all facilities within larger geographic area on   Patient informed that his/her managed care company has contracts with or will negotiate with  certain facilities, including the following:     Patient/family informed of bed offers received:  10/27/2014 [facility] recommends and patient chooses bed at    Patient to be transferred to  on  10/27/2014 Patient to be transferred to facility by [facility] Patient and family notified of transfer on 10/27/2014 Name of family member notified:    The following physician request were entered in Epic:   Additional  Comments:  Chad CordialLauren Carter, LCSWA 10/27/2014 5:04 PM [phone]

## 2014-10-25 NOTE — Clinical Social Work Psychosocial (Signed)
ACClinical Social Work Department BRIEF PSYCHOSOCIAL ASSESSMENT 10/25/2014  Patient:  Rita Jones,Rita Jones     Account Number:  [id number]     Admit date:  10/23/2014  Clinical Social Worker:  Elray BubaKujawa,Pressley Tadesse, CLINICAL SOCIAL WORKER  Date/Time:  10/25/2014 02:14 PM  Referred by:  Physician  Date Referred:  10/25/2014 Referred for  SNF Placement   Other Referral:   Interview type:  Family Other interview type:   Daughter Administrator, sportsCindy    PSYCHOSOCIAL DATA Living Status:  WITH ADULT CHILDREN Admitted from facility:   Level of care:   Primary support name:  Arline AspCindy Primary support relationship to patient:  CHILD, ADULT Degree of support available:   High/ Daughgter lives with pt, is avaialbe and supportive    CURRENT CONCERNS  Other Concerns:    SOCIAL WORK ASSESSMENT / PLAN CSW reviewed pt chart which reflected that pt was disoriented so pt's daughter was contacted to facilitate assessment.  CSW provided a description of role of CSW and prompted pt's daughter to discuss history and  needs of pt. CSW explained SNF process and asked pt's daughter to explore thoughts and feelings related to rehab.  CSW provided active and supportive listening   Assessment/plan status:  Psychosocial Support/Ongoing Assessment of Needs Other assessment/ plan:   Send pt information to SNF's in [place] area   Information/referral to community resources:    PATIENT'S/FAMILY'S RESPONSE TO PLAN OF CARE: Pt's daughter discussed living with pt since 2003 and helping care for her.  Pt's daughter stated that she was feeling "overwhelmed" and knew that the time was coming for more help because she had a "hard time getting her (pt) out of bed" before the hospitalization.  Pt's daughter happy for the CSW help in sending pt information out to SNF's and hopeful she will gain her strength back soon.    Elray Buba.Christoher Drudge, LCSW [facility] Clinical Social Worker - Weekend Coverage cell #: [phone]

## 2014-10-25 NOTE — Progress Notes (Signed)
Patient Rita Jones      DOB: [birth year]      WHQ:[id number]   Palliative Medicine Team at [facility] Progress Note    Subjective: More pleasant today. Eating some lunch. Mood better with family around.  No acute complaints   Filed Vitals:   10/25/14 1423  BP: 105/67  Pulse: 97  Temp: 97.3 F (36.3 C)  Resp: 18   Physical exam: General: Alert, NAD Chest: CTAB CVS: RRR Abdomen:soft, ND Ext: no edema Neuro: more pleasant and less irritable. Still confused.       Assessment and plan: 81 yo female with alzheimer's dementia, HTN, recurrent UTI's admitted with acute encephalopathy in setting of UTI and ? PNA.   1. Code Status: DNR after discussion today.    2. GOC:  Met with daughter Rita Jones today. She tells me her mother formally was diagnosed with dementia around 3 years ago. Has had rapid progression of her dementia in past 2 years when she stopped driving.  More rapid progression of irritability, less ambulation, bowel/bladder incontinence over past few months.  Daughter no longer able to care for her at home. Quality of life is now terrible and daughter states that in her normal cognition she would be embarrassed and horrified of what her life is now.  She has been working on getting her mother medicaid so she can pursue long term care.  We talked about progression (variability of this) in dementia and how she is starting to show signs of end stage dementia. While albumin is normal she has lost significant weight in past few months per Epic.  At this point daughter would like care to focus on comfort given extremely poor quality of life.  We discussed MOST form and filled out today.  She would not like further blood work, abx, feeding tubes, IVF (she frankly refuses a lot of this when hospitalized as is).  She will be going to SNF/rehab and I would recommend Palliative Care consult there to enroll in hospice care when medicaid comes through or she is no longer  eligible for SNF rehab.  She would like to avoid any further hospitalization if possible but this may not be feasible until hospice enrollment. I do not think she would be candidate for residential hospice facility unfortunately.  Hopefully long term nursing home care can occur in near future.  I have placed MOST in hard chart.    3. Symptom Management:  Acute Encephalopathy- Would avoid ativan and use haldol for acute agitation symptoms.   Total Time: 40 minutes >50% of time spent in counseling and coordination of care regarding above  Doran Clay D.O. Palliative Medicine Team at [facility]  Pager: [phone] Team Phone: [phone]

## 2014-10-26 DIAGNOSIS — B9620 Unspecified Escherichia coli [E. coli] as the cause of diseases classified elsewhere: Secondary | ICD-10-CM

## 2014-10-26 LAB — URINE CULTURE

## 2014-10-26 NOTE — Progress Notes (Signed)
Patient ID: Rita Jones, female   DOB: August 09, 1930, 81 y.o.   MRN: [id number] [facility] PROGRESS NOTE  Rita Jones DOB: August 09, 1930 DOA: 10/23/2014 PCP: Bufford SpikesEED, TIFFANY, DO  Brief narrative:    81 y.o. female with history of advanced dementia, hypertension, hyperlipidemia who [facility] with generalized weakness. Apparently family has more difficult time taking care of her at home. On admission she was found to have urinary tract infection and chest x-ray significant for possible pneumonia. Patient was started on azithromycin and Rocephin and admitted for further evaluation and management.  Assessment/Plan:    Principal Problem:   Acute encephalopathy / advanced dementia / depression - CT head on admission with no acute intracranial findings - Continue citalopram 10 mg daily - Continue aricept and namenda  - No significant changes in metnal status since admission   Active Problems:   Essential hypertension - Continue Avapro 150 mg daily - BP 126/74    UTI (lower urinary tract infection) secondary to E.Coli - Continue rocephin for E.Coli UTI - sensitivity report is pending      Lobar pneumonia - Continue azithromycin and Rocephin. - Chest x-ray on admission showed left basilar airspace opacity which could reflect mild pneumonia. - Influenza panel is negative  - Blood cultures not obtained at the time of the admission.    Hyperlipidemia - Continue Crestor 40 mg at bedtime    DVT Prophylaxis  - Lovenox subcutaneous while pt is in hospital    Code Status: DNR/DNI Family Communication:  Family not at bedside Disposition Plan: to SNF in next 24 hours   IV access:  Peripheral IV  Procedures and diagnostic studies:    Dg Chest 2 View 10/23/2014  Lungs mildly hypoexpanded. Mild left basilar airspace opacity could reflect mild pneumonia. Would correlate for associated symptoms. Suspect underlying moderate to large  hiatal hernia.     Ct Head Wo Contrast 10/24/2014  1. No acute intracranial pathology seen on CT. 2. Moderate cortical volume loss and scattered small vessel ischemic microangiopathy.    Medical Consultants:  Palliative care   Other Consultants:  Physical therapy Social work   IAnti-Infectives:   Azithromycin 10/24/2014 --> Rocephin 10/24/2014 -->   Manson PasseyEVINE, Jj Enyeart, MD  [facility] Pager [phone]  If 7PM-7AM, please contact night-coverage [email or web address] Password [email or web address] 10/26/2014, 12:30 PM   LOS: 2 days    HPI/Subjective: No acute overnight events.  Objective: Filed Vitals:   10/25/14 0546 10/25/14 1423 10/25/14 2051 10/26/14 0633  BP: 99/70 105/67 124/63 126/74  Pulse: 109 97 82 94  Temp: 98.4 F (36.9 C) 97.3 F (36.3 C) 98.6 F (37 C) 97.7 F (36.5 C)  TempSrc: Axillary Oral Axillary Axillary  Resp: 18 18 16 16   SpO2: 99% 95% 95% 97%    Intake/Output Summary (Last 24 hours) at 10/26/14 1230 Last data filed at 10/26/14 0926  Gross per 24 hour  Intake    240 ml  Output      0 ml  Net    240 ml    Exam:   General:  Pt is not in dsitress  Cardiovascular: RRR, S1/S2 (+)  Respiratory: no wheezing, no rhonchi   Abdomen: bowel sounds appreciated, non tender abdomen   Extremities: no leg swelling, palpable pulses     Data Reviewed: Basic Metabolic Panel:  Recent Labs Lab 10/23/14 2155  NA 137  K 4.1  CL 104  CO2 23  GLUCOSE 165*  BUN 20  CREATININE 1.00  CALCIUM 8.8   Liver Function Tests:  Recent Labs Lab 10/23/14 2155  AST 44*  ALT 29  ALKPHOS 95  BILITOT 0.8  PROT 6.6  ALBUMIN 3.5   No results for input(s): LIPASE, AMYLASE in the last 168 hours. No results for input(s): AMMONIA in the last 168 hours. CBC:  Recent Labs Lab 10/23/14 2155  WBC 9.8  HGB 13.7  HCT 42.1  MCV 87.0  PLT 260   Cardiac Enzymes: No results for input(s): CKTOTAL, CKMB, CKMBINDEX, TROPONINI in the last 168 hours. BNP: Invalid input(s):  POCBNP CBG: No results for input(s): GLUCAP in the last 168 hours.  Recent Results (from the past 240 hour(s))  Urine culture     Status: None (Preliminary result)   Collection Time: 10/23/14 10:20 PM  Result Value Ref Range Status   Specimen Description URINE, CATHETERIZED  Final   Special Requests NONE  Final   Colony Count   Final    >=100,000 COLONIES/ML Performed at [facility]    Culture   Final    ESCHERICHIA COLI Performed at [facility]    Report Status PENDING  Incomplete     Scheduled Meds: . aspirin EC  81 mg Oral Daily  . azithromycin  500 mg Intravenous QHS  . cefTRIAXone (ROCEPHIN)  IV  1 g Intravenous QHS  . citalopram  10 mg Oral Daily  . donepezil  10 mg Oral QHS  . enoxaparin (LOVENOX) injection  40 mg Subcutaneous Q24H  . irbesartan  150 mg Oral QHS  . loratadine  10 mg Oral Daily  . memantine  14 mg Oral Daily  . mirabegron ER  50 mg Oral Daily  . rosuvastatin  40 mg Oral q1800   Continuous Infusions:

## 2014-10-27 DIAGNOSIS — E785 Hyperlipidemia, unspecified: Secondary | ICD-10-CM

## 2014-10-27 MED ORDER — CITALOPRAM HYDROBROMIDE 10 MG PO TABS: 10.0000 mg | ORAL_TABLET | Freq: Every day | ORAL | Status: AC

## 2014-10-27 MED ORDER — LORAZEPAM 1 MG PO TABS: 1.0000 mg | ORAL_TABLET | Freq: Three times a day (TID) | ORAL | Status: AC | PRN

## 2014-10-27 MED ORDER — AZITHROMYCIN 500 MG PO TABS
500.0000 mg | ORAL_TABLET | Freq: Every day | ORAL | Status: DC
  Filled 2014-10-27: qty 1

## 2014-10-27 MED ORDER — LEVOFLOXACIN 750 MG PO TABS: 750.0000 mg | ORAL_TABLET | Freq: Every day | ORAL | Status: AC

## 2014-10-27 NOTE — Progress Notes (Signed)
PHARMACIST - PHYSICIAN COMMUNICATION DR:   Elisabeth Pigeonevine CONCERNING: Antibiotic IV to Oral Route Change Policy  RECOMMENDATION: This patient is receiving Azithromycin by the intravenous route.  Based on criteria approved by the Pharmacy and Therapeutics Committee, the antibiotic(s) is/are being converted to the equivalent oral dose form(s).   DESCRIPTION: These criteria include:  Patient being treated for a respiratory tract infection, urinary tract infection, cellulitis or clostridium difficile associated diarrhea if on metronidazole  The patient is not neutropenic and does not exhibit a GI malabsorption state  The patient is eating (either orally or via tube) and/or has been taking other orally administered medications for a least 24 hours  The patient is improving clinically and has a Tmax < 100.5  If you have questions about this conversion, please contact the Pharmacy Department  []   [phone]( [phone] )  [facility] []   [phone]( [phone] )  [facility]  []   [phone]( [phone] )  [facility] [x]   [phone]( [phone] )  [facility]   Dorna LeitzAnh Marshia Tropea, PharmD, BCPS

## 2014-10-27 NOTE — Progress Notes (Signed)
Discharge instructions accompanied pt, left the unit in stable condition via ambulance to SNF. 

## 2014-10-27 NOTE — Progress Notes (Signed)
CSW spoke with Pt's daughter Arline AspCindy who was agreeable to SNF placement and accepted bed offer at [facility]. CSW helped coordinate completion of paperwork by Pt's daughter via fax with [facility].    CSW scheduled [facility] transportation for 4:30pm.  DC packet completed and placed on chart. RN notified.   CSW signing off but available if needs arise.  Chad CordialLauren Carter, LCSWA 10/27/2014 5:07 PM [phone]

## 2014-10-27 NOTE — Discharge Summary (Signed)
Physician Discharge Summary  Rita Jones EAV:[id number] DOB: Apr 02, 1931 DOA: 10/23/2014  PCP: Bufford Spikes, DO  Admit date: 10/23/2014 Discharge date: 10/27/2014  Recommendations for Outpatient Follow-up:  1. Take Levaquin for 6 days on discharge for treatment of pneumonia  Discharge Diagnoses:  Principal Problem:   Acute encephalopathy Active Problems:   Essential hypertension   UTI (lower urinary tract infection)   Dementia   Hyperlipidemia   Lobar pneumonia   Palliative care encounter   Goals of care, counseling/discussion    Discharge Condition: stable   Diet recommendation: as tolerated   History of present illness:  81 y.o. female with history of advanced dementia, hypertension, hyperlipidemia who presented to [facility] with generalized weakness. Apparently family has more difficult time taking care of her at home. On admission she was found to have urinary tract infection and chest x-ray significant for possible pneumonia. Patient was started on azithromycin and Rocephin and admitted for further evaluation and management.  Assessment/Plan:    Principal Problem:  Acute encephalopathy / advanced dementia / depression - CT head on admission with no acute intracranial findings - Continue citalopram 10 mg daily - Continue aricept/namenda  - No significant changes in metnal status since admission   Active Problems:  Essential hypertension - Continue Avapro 150 mg daily   UTI (lower urinary tract infection) secondary to E.Coli - Continue rocephin for E.Coli UTI - Sensitive to Levaquin which patient will continue for pneumonia.   Lobar pneumonia - Patient was on azithromycin and Rocephin since the admission. - Chest x-ray on admission showed left basilar airspace opacity which could reflect mild pneumonia. - Influenza panel is negative  - Blood cultures not obtained at the time of the admission. - Patient will continue Levaquin for 6  days on discharge.   Hyperlipidemia - Continue Crestor 40 mg at bedtime    DVT Prophylaxis  - Lovenox subcutaneous while patient in hospital.   Code Status: DNR/DNI Family Communication: Family not at bedside   IV access:  Peripheral IV  Procedures and diagnostic studies:   Dg Chest 2 View 10/23/2014 Lungs mildly hypoexpanded. Mild left basilar airspace opacity could reflect mild pneumonia. Would correlate for associated symptoms. Suspect underlying moderate to large hiatal hernia.   Ct Head Wo Contrast 10/24/2014 1. No acute intracranial pathology seen on CT. 2. Moderate cortical volume loss and scattered small vessel ischemic microangiopathy.    Medical Consultants:  Palliative care   Other Consultants:  Physical therapy Social work   IAnti-Infectives:   Azithromycin 10/24/2014 --> 10/27/2014  Rocephin 10/24/2014 --> 10/27/2014 Levaquin for 6 days on discharge  Signed:  Manson Passey, MD  [facility] 10/27/2014, 10:39 AM  Pager #: [phone]    Discharge Exam: Filed Vitals:   10/27/14 0410  BP: 116/64  Pulse: 79  Temp: 97.7 F (36.5 C)  Resp: 18   Filed Vitals:   10/26/14 0633 10/26/14 1400 10/26/14 2145 10/27/14 0410  BP: 126/74 128/66 93/56 116/64  Pulse: 94 72 85 79  Temp: 97.7 F (36.5 C) 97.5 F (36.4 C) 98.2 F (36.8 C) 97.7 F (36.5 C)  TempSrc: Axillary Oral Oral Axillary  Resp: SpO2: 97% 94% 94% 94%    General: Pt is alert, not in acute distress Cardiovascular: Regular rate and rhythm, S1/S2 (+) Respiratory: no wheezing, no crackles, no rhonchi Abdominal: Soft, non tender, non distended, bowel sounds + Extremities: no cyanosis, pulses palpable  Neuro: Grossly nonfocal  Discharge Instructions  Discharge Instructions  Call MD for:  difficulty breathing, headache or visual disturbances    Complete by:  As directed      Call MD for:  persistant dizziness or light-headedness    Complete by:  As  directed      Call MD for:  persistant nausea and vomiting    Complete by:  As directed      Call MD for:  severe uncontrolled pain    Complete by:  As directed      Diet - low sodium heart healthy    Complete by:  As directed      Discharge instructions    Complete by:  As directed   1. Take Levaquin for 6 days on discharge for treatment of pneumonia     Increase activity slowly    Complete by:  As directed             Medication List    TAKE these medications        aspirin 81 MG EC tablet  Take 81 mg by mouth daily.     cetirizine 10 MG tablet  Commonly known as:  ZYRTEC  Take 10 mg by mouth daily.     citalopram 10 MG tablet  Commonly known as:  CELEXA  Take 1 tablet (10 mg total) by mouth daily.     CoQ10 100 MG Caps  Take by mouth daily.     Cranberry 500 MG Caps  Take by mouth daily.     CRESTOR 40 MG tablet  Generic drug:  rosuvastatin  TAKE ONE TABLET BY MOUTH ONCE DAILY     Fish Oil 1200 MG Caps  Take by mouth. 2 by mouth daily     irbesartan 150 MG tablet  Commonly known as:  AVAPRO  TAKE ONE TABLET BY MOUTH AT BEDTIME     levofloxacin 750 MG tablet  Commonly known as:  LEVAQUIN  Take 1 tablet (750 mg total) by mouth daily.     LORazepam 1 MG tablet  Commonly known as:  ATIVAN  Take 1 tablet (1 mg total) by mouth every 8 (eight) hours as needed (agitation).     Memantine HCl-Donepezil HCl 14-10 MG Cp24  Commonly known as:  NAMZARIC  Take 1 capsule by mouth daily.     mirabegron ER 50 MG Tb24 tablet  Commonly known as:  MYRBETRIQ  Take 1 tablet (50 mg total) by mouth daily.     multivitamin tablet  Take 1 tablet by mouth daily.           Follow-up Information    Follow up with REED, TIFFANY, DO. Schedule an appointment as soon as possible for a visit in 1 week.   Specialty:  Geriatric Medicine   Why:  Follow up appt after recent hospitalization   Contact information:   [address] [place] [address] [phone]         The results of significant diagnostics from this hospitalization (including imaging, microbiology, ancillary and laboratory) are listed below for reference.    Significant Diagnostic Studies: Dg Chest 2 View  10/23/2014   CLINICAL DATA:  Worsening agitation.  Initial encounter.  EXAM: CHEST  2 VIEW  COMPARISON:  None.  FINDINGS: The lungs are mildly hypoexpanded. Mild left basilar airspace opacity could reflect mild pneumonia. Would correlate for associated symptoms. There is no evidence of pleural effusion or pneumothorax.  The heart is normal in size; the mediastinal contour is within normal limits. No acute osseous  abnormalities are seen. An underlying moderate to large hiatal hernia is suspected.  IMPRESSION: Lungs mildly hypoexpanded. Mild left basilar airspace opacity could reflect mild pneumonia. Would correlate for associated symptoms. Suspect underlying moderate to large hiatal hernia.   Electronically Signed   By: Roanna Raider M.D.   On: 10/23/2014 23:58   Ct Head Wo Contrast  10/24/2014   CLINICAL DATA:  Acute onset of increased agitation. Initial encounter.  EXAM: CT HEAD WITHOUT CONTRAST  TECHNIQUE: Contiguous axial images were obtained from the base of the skull through the vertex without intravenous contrast.  COMPARISON:  CT of the head performed 04/02/2013  FINDINGS: There is no evidence of acute infarction, mass lesion, or intra- or extra-axial hemorrhage on CT.  Prominence of the ventricles and sulci reflects moderate cortical volume loss. Cerebellar atrophy is noted. Scattered periventricular and subcortical white matter change likely reflects small vessel ischemic microangiopathy.  The brainstem and fourth ventricle are within normal limits. The basal ganglia are unremarkable in appearance. The cerebral hemispheres demonstrate grossly normal gray-white differentiation. No mass effect or midline shift is seen.  There is no evidence of fracture; visualized osseous structures are  unremarkable in appearance. The orbits are within normal limits. The paranasal sinuses and mastoid air cells are well-aerated. No significant soft tissue abnormalities are seen.  IMPRESSION: 1. No acute intracranial pathology seen on CT. 2. Moderate cortical volume loss and scattered small vessel ischemic microangiopathy.   Electronically Signed   By: Roanna Raider M.D.   On: 10/24/2014 01:09    Microbiology: Recent Results (from the past 240 hour(s))  Urine culture     Status: None   Collection Time: 10/23/14 10:20 PM  Result Value Ref Range Status   Specimen Description URINE, CATHETERIZED  Final   Special Requests NONE  Final   Colony Count   Final    >=100,000 COLONIES/ML Performed at [facility]    Culture   Final    ESCHERICHIA COLI Performed at [facility]    Report Status 10/26/2014 FINAL  Final   Organism ID, Bacteria ESCHERICHIA COLI  Final      Susceptibility   Escherichia coli - MIC*    AMPICILLIN <=2 SENSITIVE Sensitive     CEFAZOLIN <=4 SENSITIVE Sensitive     CEFTRIAXONE <=1 SENSITIVE Sensitive     CIPROFLOXACIN <=0.25 SENSITIVE Sensitive     GENTAMICIN <=1 SENSITIVE Sensitive     LEVOFLOXACIN <=0.12 SENSITIVE Sensitive     NITROFURANTOIN <=16 SENSITIVE Sensitive     TOBRAMYCIN <=1 SENSITIVE Sensitive     TRIMETH/SULFA <=20 SENSITIVE Sensitive     PIP/TAZO <=4 SENSITIVE Sensitive     * ESCHERICHIA COLI     Labs: Basic Metabolic Panel:  Recent Labs Lab 10/23/14 2155  NA 137  K 4.1  CL 104  CO2 23  GLUCOSE 165*  BUN 20  CREATININE 1.00  CALCIUM 8.8   Liver Function Tests:  Recent Labs Lab 10/23/14 2155  AST 44*  ALT 29  ALKPHOS 95  BILITOT 0.8  PROT 6.6  ALBUMIN 3.5   No results for input(s): LIPASE, AMYLASE in the last 168 hours. No results for input(s): AMMONIA in the last 168 hours. CBC:  Recent Labs Lab 10/23/14 2155  WBC 9.8  HGB 13.7  HCT 42.1  MCV 87.0  PLT 260   Cardiac Enzymes: No results for  input(s): CKTOTAL, CKMB, CKMBINDEX, TROPONINI in the last 168 hours. BNP: BNP (last 3 results) No results for input(s): BNP  in the last 8760 hours.  ProBNP (last 3 results) No results for input(s): PROBNP in the last 8760 hours.  CBG: No results for input(s): GLUCAP in the last 168 hours.  Time coordinating discharge: Over 30 minutes

## 2014-10-27 NOTE — Discharge Instructions (Signed)

## 2014-10-27 NOTE — H&P (Signed)
Report called to Glade [facility]berly RN at [facility].Awaiting transportation.

## 2014-11-03 ENCOUNTER — Ambulatory Visit: Payer: PPO | Admitting: Internal Medicine

## 2014-11-20 ENCOUNTER — Ambulatory Visit: Payer: PPO | Admitting: Internal Medicine

## 2015-03-28 IMAGING — CR DG NASAL BONES 3+V
3 series · 3 of 3 positions shown · non-contrast
Comparison: No priors.

CLINICAL DATA: History of fall with laceration to bridge of the
nose.

NASAL BONES - 3+ VIEW

[w waters pa]
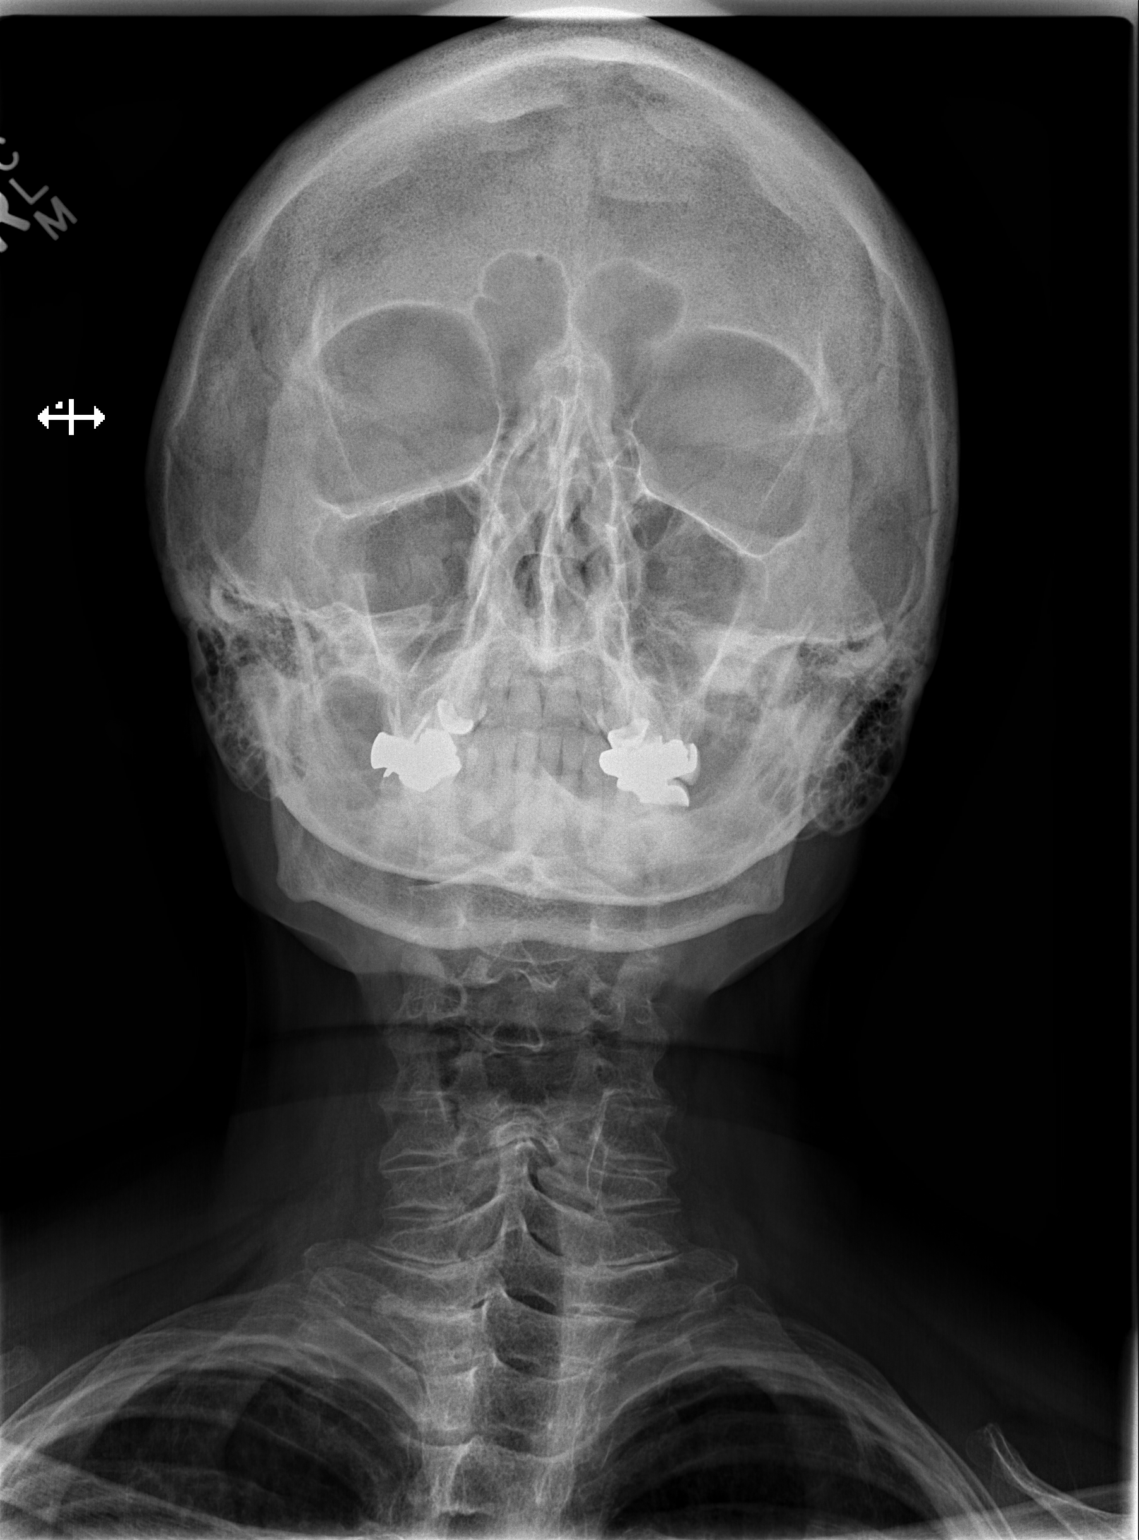

[w nasal bone lat (1 of 2)]
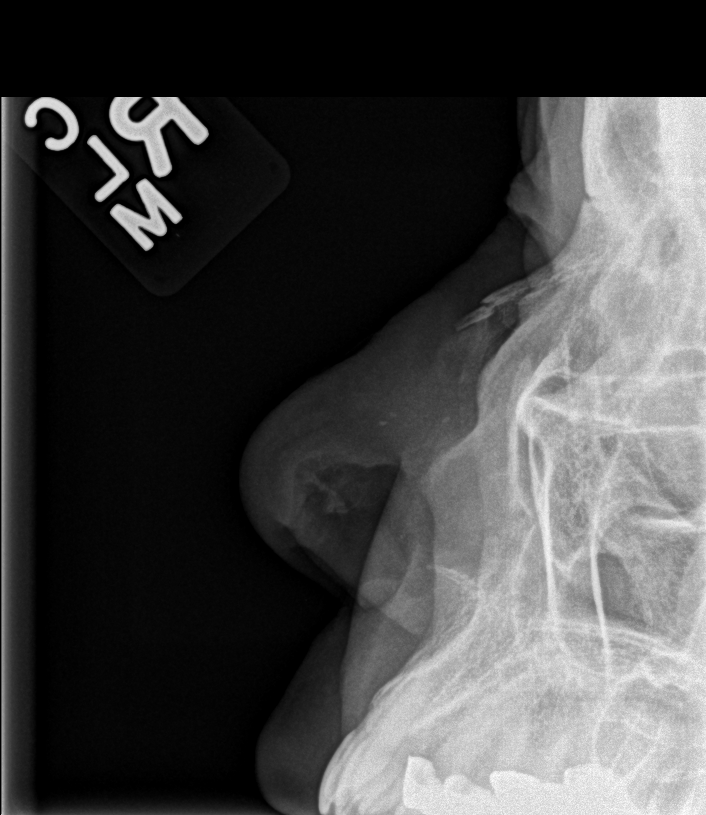

[w nasal bone lat (2 of 2)]
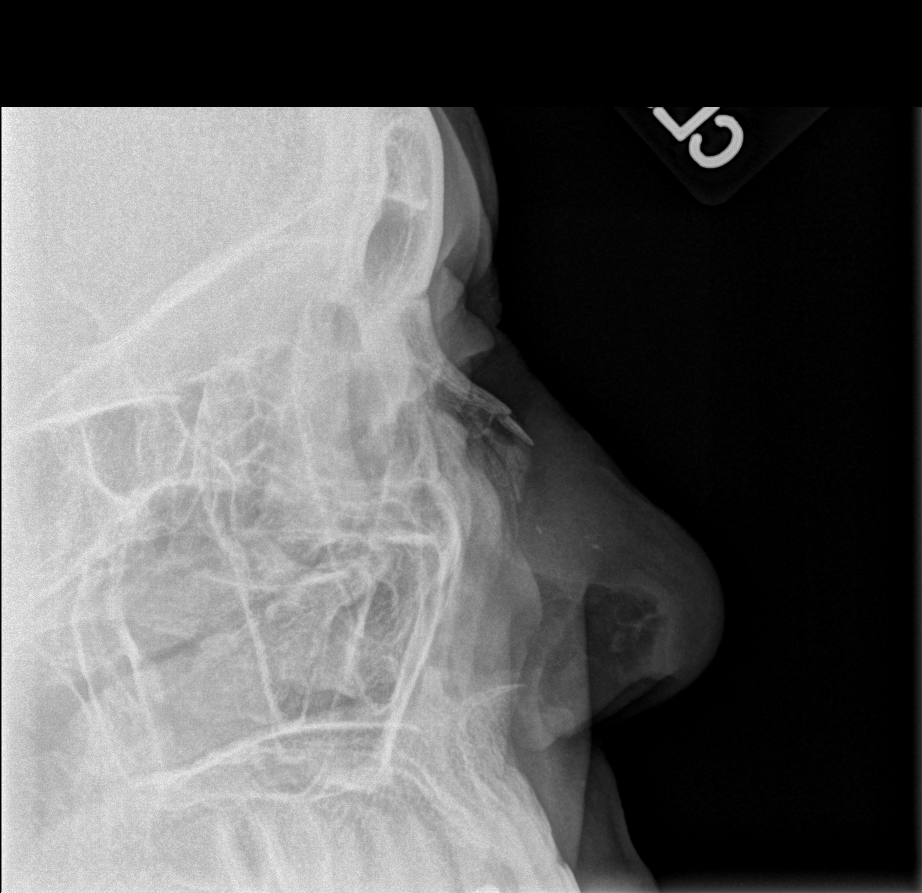

[3 of 3 positions shown; findings below may reference images not displayed]

FINDINGS: Three views of the nasal bones demonstrate a minimally
depressed nasal bone fracture.  Paranasal sinuses appear well
pneumatized without definite air fluid levels to suggest hemosinus.
IMPRESSION: 1.  Minimally depressed nasal bone fracture.

## 2015-05-04 ENCOUNTER — Encounter: Payer: Self-pay | Admitting: Gastroenterology

## 2016-09-22 DEATH — deceased

## 2016-09-27 ENCOUNTER — Telehealth: Payer: Self-pay | Admitting: General Practice

## 2017-01-08 IMAGING — CT CT HEAD W/O CM
2 series · 15 of 30 positions shown, 19 images · non-contrast
Comparison: CT of the head performed 04/02/2013

CLINICAL DATA: Acute onset of increased agitation. Initial
encounter.

EXAM:
CT HEAD WITHOUT CONTRAST
TECHNIQUE: Contiguous axial images were obtained from the base of the skull
through the vertex without intravenous contrast.

[Series 3: head w/o · axial · non-contrast · 0.45mm/px · z∈[+1134,+1264]mm · 13 of 32 slices shown, 17 images]
[im 3/32  brain]
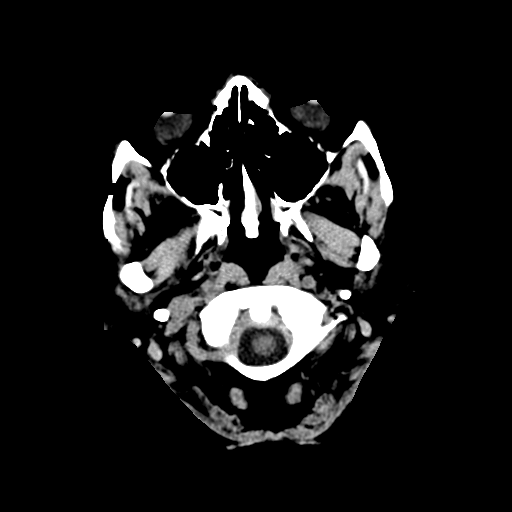
[im 3/32  bone]
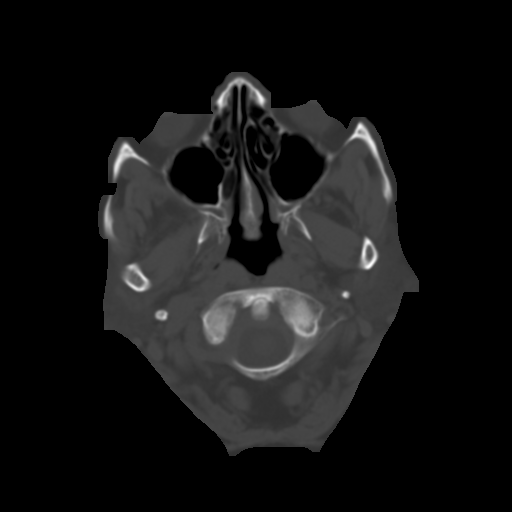
[im 5/32  brain]
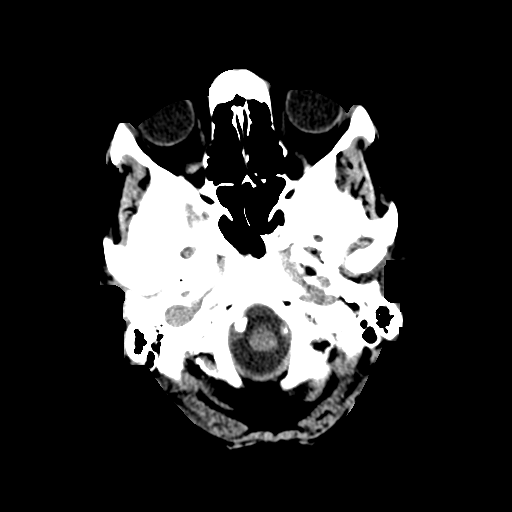
[im 7/32  brain]
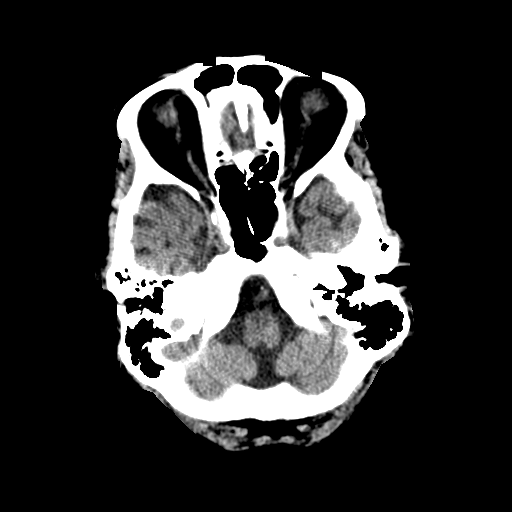
[im 9/32  brain]
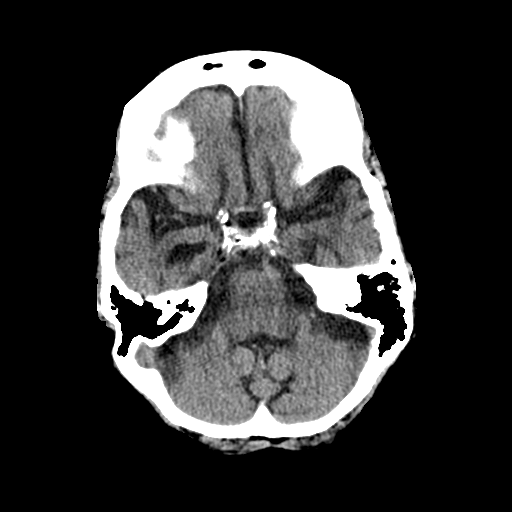
[im 12/32  brain]
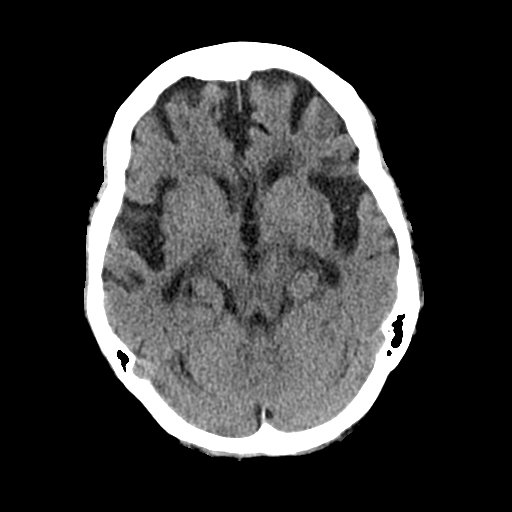
[im 12/32  bone]
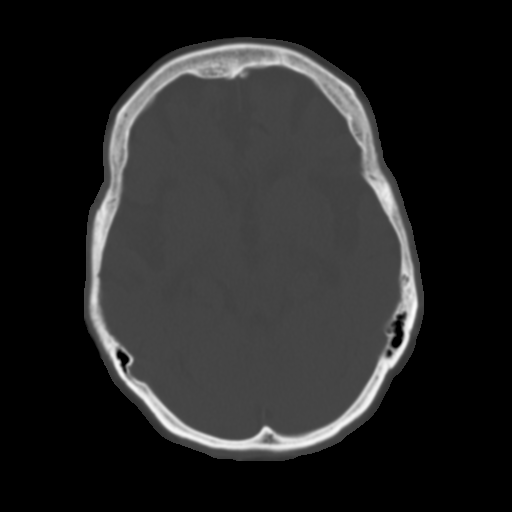
[im 14/32  brain]
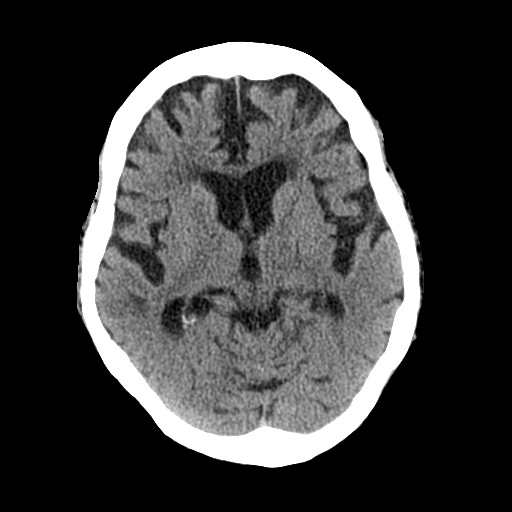
[im 16/32  brain]
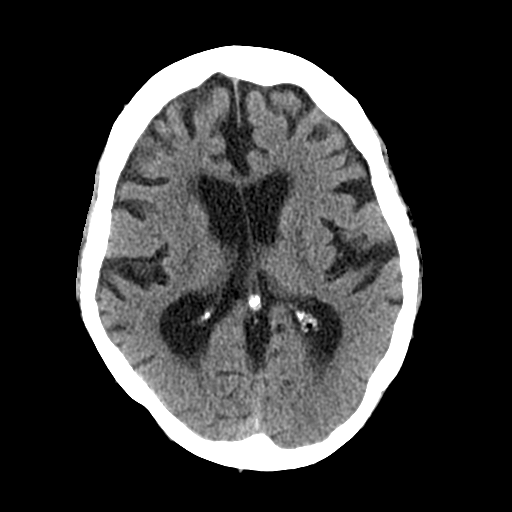
[im 18/32  brain]
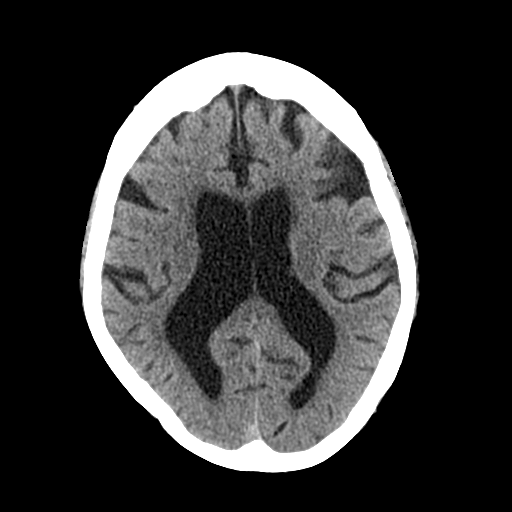
[im 20/32  brain]
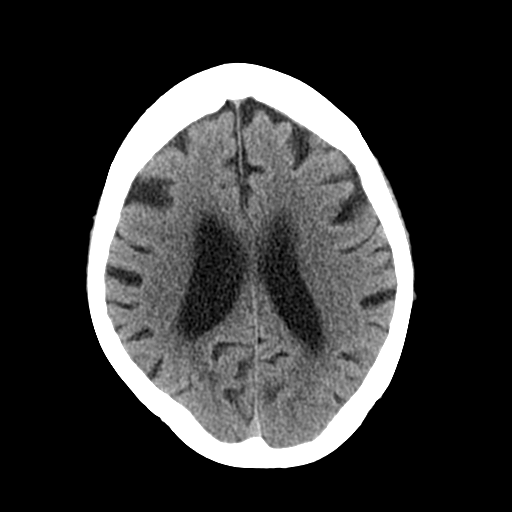
[im 20/32  bone]
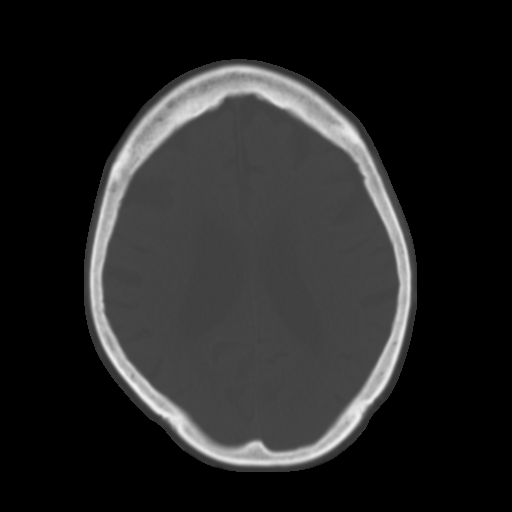
[im 23/32  brain]
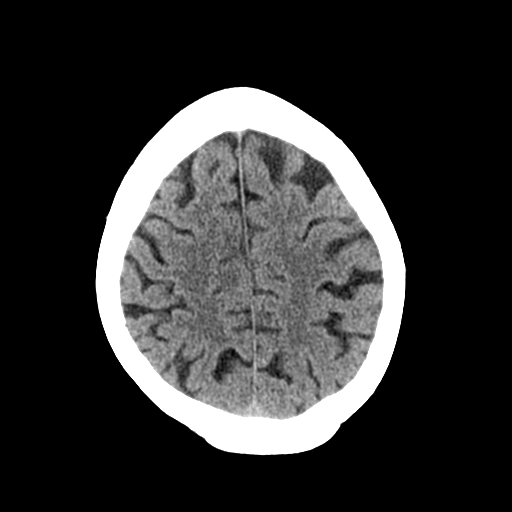
[im 25/32  brain]
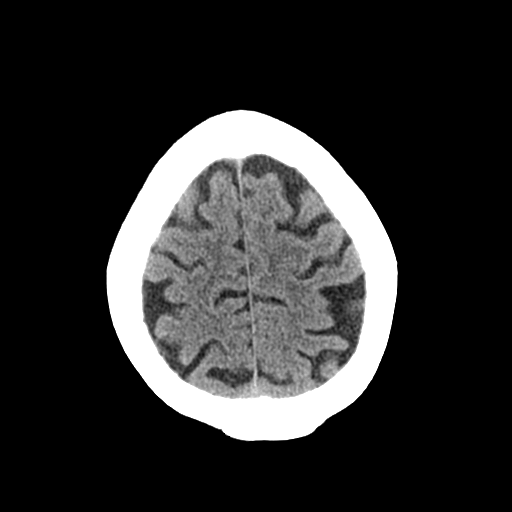
[im 27/32  brain]
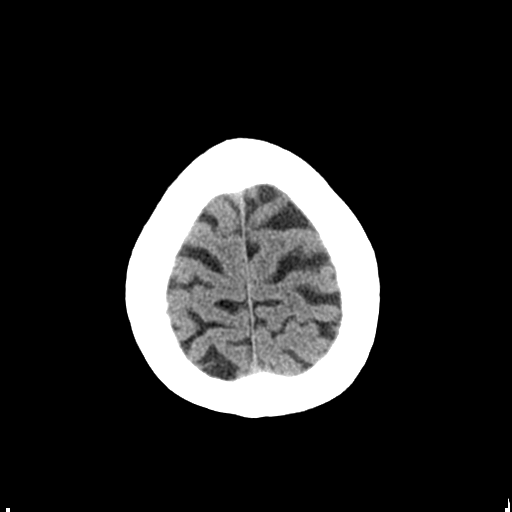
[im 29/32  brain]
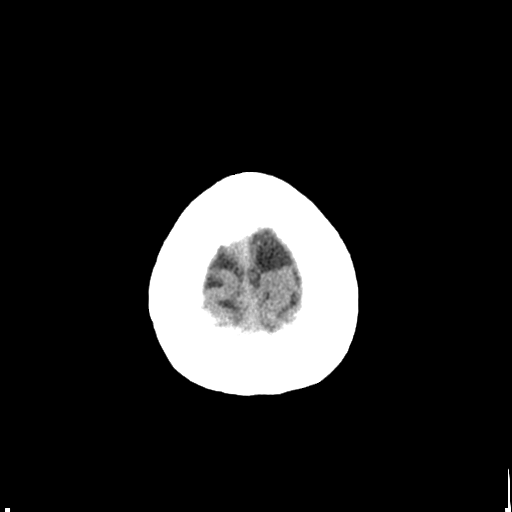
[im 29/32  bone]
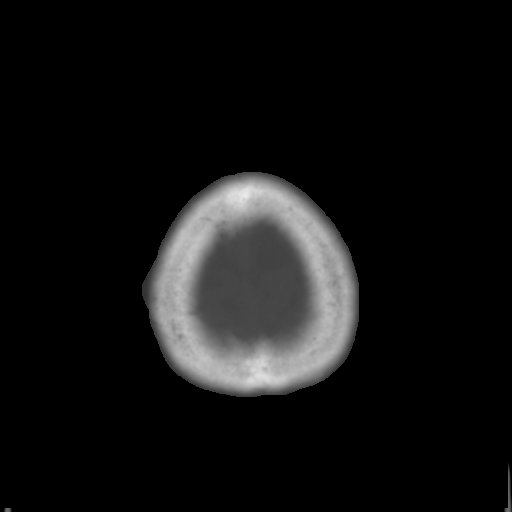

[Series 4: bone windows · axial · 0.45mm/px · z∈[+1134,+1154]mm · 2 of 32 slices shown]
[im 3/32  bone]
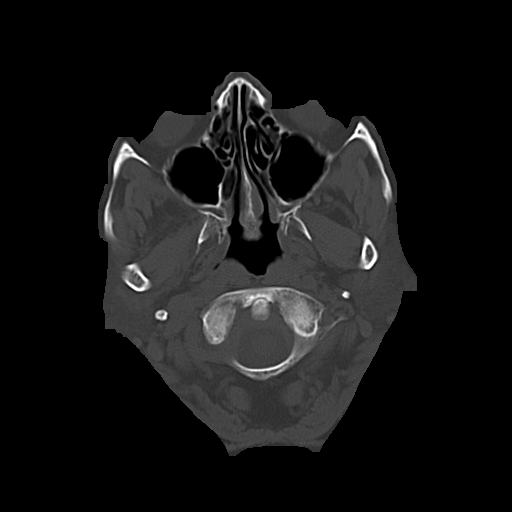
[im 7/32  bone]
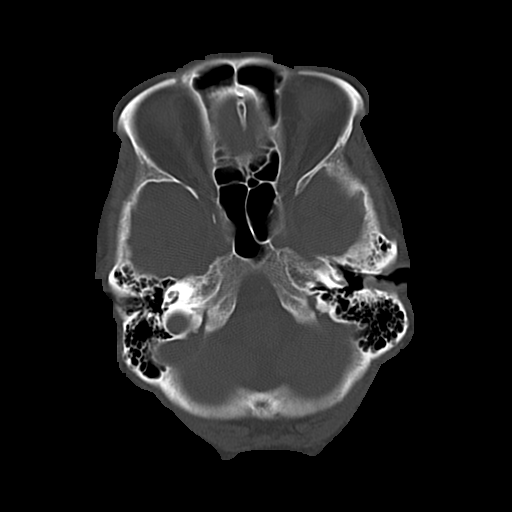

[15 of 30 positions shown; findings below may reference images not displayed]

FINDINGS: There is no evidence of acute infarction, mass lesion, or intra- or
extra-axial hemorrhage on CT.

Prominence of the ventricles and sulci reflects moderate cortical
volume loss. Cerebellar atrophy is noted. Scattered periventricular
and subcortical white matter change likely reflects small vessel
ischemic microangiopathy.

The brainstem and fourth ventricle are within normal limits. The
basal ganglia are unremarkable in appearance. The cerebral
hemispheres demonstrate grossly normal gray-white differentiation.
No mass effect or midline shift is seen.

There is no evidence of fracture; visualized osseous structures are
unremarkable in appearance. The orbits are within normal limits. The
paranasal sinuses and mastoid air cells are well-aerated. No
significant soft tissue abnormalities are seen.
IMPRESSION: 1. No acute intracranial pathology seen on CT.
2. Moderate cortical volume loss and scattered small vessel ischemic
microangiopathy.

## 2017-08-28 NOTE — Telephone Encounter (Signed)
Handled..[facility]  °
